# Patient Record
Sex: Male | Born: 1945 | Hispanic: No | Marital: Single | Smoking: Former smoker
Health system: Southern US, Community
[De-identification: ages and names within clinical notes are randomized; demographics above are authoritative.]

## PROBLEM LIST (undated history)

## (undated) DIAGNOSIS — R569 Unspecified convulsions: Secondary | ICD-10-CM

## (undated) DIAGNOSIS — J309 Allergic rhinitis, unspecified: Secondary | ICD-10-CM

## (undated) DIAGNOSIS — I10 Essential (primary) hypertension: Secondary | ICD-10-CM

## (undated) DIAGNOSIS — M109 Gout, unspecified: Secondary | ICD-10-CM

## (undated) DIAGNOSIS — F329 Major depressive disorder, single episode, unspecified: Secondary | ICD-10-CM

## (undated) DIAGNOSIS — D649 Anemia, unspecified: Secondary | ICD-10-CM

## (undated) DIAGNOSIS — E7800 Pure hypercholesterolemia, unspecified: Secondary | ICD-10-CM

## (undated) DIAGNOSIS — C61 Malignant neoplasm of prostate: Secondary | ICD-10-CM

## (undated) DIAGNOSIS — I739 Peripheral vascular disease, unspecified: Secondary | ICD-10-CM

## (undated) DIAGNOSIS — F419 Anxiety disorder, unspecified: Secondary | ICD-10-CM

## (undated) DIAGNOSIS — I96 Gangrene, not elsewhere classified: Secondary | ICD-10-CM

## (undated) DIAGNOSIS — I639 Cerebral infarction, unspecified: Secondary | ICD-10-CM

## (undated) DIAGNOSIS — F32A Depression, unspecified: Secondary | ICD-10-CM

## (undated) DIAGNOSIS — Z923 Personal history of irradiation: Secondary | ICD-10-CM

## (undated) DIAGNOSIS — R000 Tachycardia, unspecified: Secondary | ICD-10-CM

## (undated) DIAGNOSIS — C3490 Malignant neoplasm of unspecified part of unspecified bronchus or lung: Secondary | ICD-10-CM

## (undated) DIAGNOSIS — I219 Acute myocardial infarction, unspecified: Secondary | ICD-10-CM

## (undated) DIAGNOSIS — I2510 Atherosclerotic heart disease of native coronary artery without angina pectoris: Secondary | ICD-10-CM

## (undated) DIAGNOSIS — G8190 Hemiplegia, unspecified affecting unspecified side: Secondary | ICD-10-CM

## (undated) HISTORY — DX: Unspecified convulsions: R56.9

## (undated) HISTORY — DX: Allergic rhinitis, unspecified: J30.9

## (undated) HISTORY — DX: Essential (primary) hypertension: I10

## (undated) HISTORY — PX: ABOVE KNEE LEG AMPUTATION: SUR20

---

## 2007-04-27 ENCOUNTER — Encounter: Admission: RE | Admit: 2007-04-27 | Discharge: 2007-06-09 | Payer: Self-pay | Admitting: Cardiovascular Disease

## 2007-06-18 ENCOUNTER — Inpatient Hospital Stay (HOSPITAL_COMMUNITY): Admission: EM | Admit: 2007-06-18 | Discharge: 2007-06-22 | Payer: Self-pay | Admitting: Emergency Medicine

## 2007-06-20 ENCOUNTER — Encounter (INDEPENDENT_AMBULATORY_CARE_PROVIDER_SITE_OTHER): Payer: Self-pay | Admitting: Internal Medicine

## 2007-06-30 ENCOUNTER — Ambulatory Visit: Payer: Self-pay | Admitting: Internal Medicine

## 2007-07-15 ENCOUNTER — Ambulatory Visit: Payer: Self-pay | Admitting: Internal Medicine

## 2007-08-09 ENCOUNTER — Ambulatory Visit: Payer: Self-pay | Admitting: Internal Medicine

## 2007-09-19 ENCOUNTER — Ambulatory Visit: Payer: Self-pay | Admitting: Internal Medicine

## 2007-09-19 ENCOUNTER — Ambulatory Visit: Payer: Self-pay | Admitting: Vascular Surgery

## 2007-09-19 ENCOUNTER — Emergency Department (HOSPITAL_COMMUNITY): Admission: EM | Admit: 2007-09-19 | Discharge: 2007-09-19 | Payer: Self-pay | Admitting: Emergency Medicine

## 2007-10-03 ENCOUNTER — Ambulatory Visit: Payer: Self-pay | Admitting: Vascular Surgery

## 2007-10-23 ENCOUNTER — Inpatient Hospital Stay (HOSPITAL_COMMUNITY): Admission: EM | Admit: 2007-10-23 | Discharge: 2007-11-08 | Payer: Self-pay | Admitting: Emergency Medicine

## 2007-10-23 ENCOUNTER — Ambulatory Visit: Payer: Self-pay | Admitting: Internal Medicine

## 2007-10-26 ENCOUNTER — Encounter: Payer: Self-pay | Admitting: Vascular Surgery

## 2007-10-26 ENCOUNTER — Encounter (INDEPENDENT_AMBULATORY_CARE_PROVIDER_SITE_OTHER): Payer: Self-pay | Admitting: Cardiovascular Disease

## 2007-10-26 ENCOUNTER — Ambulatory Visit: Payer: Self-pay | Admitting: Vascular Surgery

## 2007-12-05 ENCOUNTER — Ambulatory Visit: Payer: Self-pay | Admitting: Vascular Surgery

## 2008-01-03 ENCOUNTER — Ambulatory Visit: Payer: Self-pay | Admitting: Internal Medicine

## 2008-05-09 ENCOUNTER — Ambulatory Visit: Payer: Self-pay | Admitting: Internal Medicine

## 2008-07-31 ENCOUNTER — Encounter: Admission: RE | Admit: 2008-07-31 | Discharge: 2008-09-26 | Payer: Self-pay | Admitting: Internal Medicine

## 2008-10-02 ENCOUNTER — Encounter: Admission: RE | Admit: 2008-10-02 | Discharge: 2008-11-21 | Payer: Self-pay | Admitting: Internal Medicine

## 2009-01-13 ENCOUNTER — Encounter: Admission: RE | Admit: 2009-01-13 | Discharge: 2009-04-13 | Payer: Self-pay | Admitting: Internal Medicine

## 2009-03-10 ENCOUNTER — Emergency Department (HOSPITAL_COMMUNITY): Admission: EM | Admit: 2009-03-10 | Discharge: 2009-03-10 | Payer: Self-pay | Admitting: Emergency Medicine

## 2009-04-22 DIAGNOSIS — C61 Malignant neoplasm of prostate: Secondary | ICD-10-CM

## 2009-04-22 HISTORY — PX: PROSTATE BIOPSY: SHX241

## 2009-04-22 HISTORY — DX: Malignant neoplasm of prostate: C61

## 2009-06-05 ENCOUNTER — Ambulatory Visit: Admission: RE | Admit: 2009-06-05 | Discharge: 2009-09-03 | Payer: Self-pay | Admitting: Radiation Oncology

## 2009-09-03 ENCOUNTER — Ambulatory Visit: Admission: RE | Admit: 2009-09-03 | Discharge: 2009-09-26 | Payer: Self-pay | Admitting: Radiation Oncology

## 2009-09-29 ENCOUNTER — Ambulatory Visit: Admission: RE | Admit: 2009-09-29 | Discharge: 2009-10-31 | Payer: Self-pay | Admitting: Radiation Oncology

## 2011-01-04 LAB — DIFFERENTIAL
Basophils Absolute: 0 10*3/uL (ref 0.0–0.1)
Basophils Relative: 1 % (ref 0–1)
Lymphocytes Relative: 38 % (ref 12–46)
Monocytes Absolute: 0.3 10*3/uL (ref 0.1–1.0)
Monocytes Relative: 7 % (ref 3–12)
Neutrophils Relative %: 49 % (ref 43–77)

## 2011-01-04 LAB — URINALYSIS, ROUTINE W REFLEX MICROSCOPIC
Glucose, UA: NEGATIVE mg/dL
Hgb urine dipstick: NEGATIVE
Ketones, ur: 15 mg/dL — AB
Nitrite: NEGATIVE
Specific Gravity, Urine: 1.023 (ref 1.005–1.030)
Urobilinogen, UA: 1 mg/dL (ref 0.0–1.0)
pH: 6 (ref 5.0–8.0)

## 2011-01-04 LAB — CBC
HCT: 40.7 % (ref 39.0–52.0)
MCHC: 33.7 g/dL (ref 30.0–36.0)
MCV: 93.7 fL (ref 78.0–100.0)
RBC: 4.34 MIL/uL (ref 4.22–5.81)
RDW: 13.9 % (ref 11.5–15.5)
WBC: 4.1 10*3/uL (ref 4.0–10.5)

## 2011-01-04 LAB — BASIC METABOLIC PANEL
Chloride: 109 mEq/L (ref 96–112)
GFR calc Af Amer: >60 mL/min (ref >60)
GFR calc non Af Amer: >60 mL/min (ref >60)

## 2011-01-04 LAB — HEMOCCULT GUIAC POC 1CARD (OFFICE): Fecal Occult Bld: NEGATIVE

## 2011-01-04 LAB — URINE CULTURE: Colony Count: NO GROWTH

## 2011-02-09 NOTE — H&P (Signed)
NAMESILVERIO, HAGAN            ACCOUNT NO.:  [id number]   MEDICAL RECORD NO.:  [id number]          PATIENT TYPE:  INP   LOCATION:  3028                         FACILITY:  [facility]   PHYSICIAN:  Della Goo, M.D. DATE OF BIRTH:  03/17/1946   DATE OF ADMISSION:  06/18/2007  DATE OF DISCHARGE:                              HISTORY & PHYSICAL   PRIMARY MEDICAL DOCTOR:  This is an unassigned patient.   CHIEF COMPLAINT:  Left-sided facial droop.   HISTORY OF PRESENT ILLNESS:  This is a 64 year old male presenting to  the emergency department emergently secondary to sudden onset of mild  twisting and left-sided facial droop.  The patient reports having these  symptoms 40 minutes prior to calling EMS.  He also reports having a  history of five previous cerebrovascular accidents in the past.  The  patient has recently relocated to this area from [place], [place].  He reports having mouth twisting and worsening difficulty  speaking.   PAST MEDICAL HISTORY:  1. CVAs times five per his report.  2. Hyperlipidemia.  3. Coronary artery disease.   PAST SURGICAL HISTORY:  Open heart surgery.   MEDICATIONS:  Aspirin, Plavix, loratadine, Lipitor, Melanex, Meloxicam  and Darvocet p.r.n.   ALLERGIES:  NO KNOWN DRUG ALLERGIES.   SOCIAL HISTORY:  The patient lives with his brother and his sister-in-  law who live in the area.  He is a smoker, smokes one half-pack per day,  and he denies alcohol usage in any illicit drug usage.   FAMILY HISTORY:  The patient is unable to give.   REVIEW OF SYSTEMS:  The patient denies having any headache, nausea,  vomiting, fever, chills, chest pain or shortness of breath.  He denies  having any syncope.  He does report having dizziness and weakness along  the right side of his body.   REVIEW OF SYSTEMS:  Pertinents are mentioned above.   PHYSICAL EXAMINATION FINDINGS:  GENERAL:  This is an older than stated  age appearing 65 year old  well-nourished, well-developed male in  discomfort, but no acute distress.  Currently in no acute distress.  VITAL SIGNS: Temperature 98.5, blood pressure 125/79, heart rate 77,  respirations 18, O2 saturations 98% on room air.  HEENT:  Normocephalic, atraumatic.  There is a left-sided facial  drooping.  Pupils are reactive to light bilaterally, extraocular muscles  are intact.  Oropharynx clear.  Dentition poor.  NECK:  Supple, full range of motion.  No thyromegaly, adenopathy,  jugulovenous distention.  No carotid bruits.  CARDIOVASCULAR:  Regular rate and rhythm.  No murmurs, gallops or rubs.  LUNGS:  Clear to auscultation bilaterally.  ABDOMEN:  Positive bowel sounds, soft, nontender, nondistended.  EXTREMITIES:  Without cyanosis, clubbing or edema.   LABORATORY STUDIES:  White blood cell count 6.4, hemoglobin 14.7,  hematocrit 42.9, MCV 94.6, platelets 156, neutrophils 49%, lymphocytes  36%, sodium 140, potassium 4.3, chloride 103, bicarb 29.4, BUN 12,  creatinine 1.1 and glucose 97.  Protime 13.9, INR of 1.1, PTT 30,  sedimentation rate 3.   ASSESSMENT:  53. A 65 year old male being admitted with a cerebrovascular  accident.  2. Hyperlipidemia.  3. Tobacco abuse.  4. History of seizures.   PLAN:  The patient will be admitted to telemetry area for cardiac  monitoring.  Cardiac enzymes will be performed.  The patient will be  placed on nitrates, oxygen, aspirin therapy.  Neurologic checks will  also be ordered q.2 h times 6 and then q. 4 hours x3.  A CT of the brain  has been ordered without contrast.  An MRI and MRA study will be ordered  along with carotid ultrasound studies.  The patient will be placed on  aspirin therapy along with DVT and GI prophylaxis.  A nicotine patch has  been ordered.   ADDENDUM:  The patient was moved to the floor and suffered a seizure  which was witnessed by nursing staff.  The patient was medicated with IV  Ativan and then loaded with Dilantin  therapy and started on Dilantin.  The alcohol withdrawal protocol was also ordered.  And an alcohol level  was also ordered.      Della Goo, M.D.  Electronically Signed     HJ/MEDQ  D:  06/18/2007  T:  06/19/2007  Job:  [id number]

## 2011-02-09 NOTE — Consult Note (Signed)
[name], [name]            ACCOUNT NO.:  [id number]   MEDICAL RECORD NO.:  [id number]          PATIENT TYPE:  INP   LOCATION:  3028                         FACILITY:  [facility]   PHYSICIAN:  Pramod P. Pearlean Brownie, MD    DATE OF BIRTH:  September 08, 1946   DATE OF CONSULTATION:  DATE OF DISCHARGE:                                 CONSULTATION   REFERRING PHYSICIAN:  [facility].   REASON FOR REFERRAL:  Seizure.   HISTORY OF PRESENT ILLNESS:  [name] is a 65 year old Caucasian  male who was admitted yesterday with episode of left facial twitching  for about five to ten minutes.  This resolved after he came to the  emergency room.  He has had previous such episodes since 2003; he can  remember about three or four such occasions which were also transient;  did not seek medical help at that time.  He does have a significant past  neurological history of large right hemispheric infarct in 2002 with  residual left-sided weakness and vision loss.  He has had some transient  worsening in the past which he calls strokes that were probably related  to seizures and transient weakness from that.  He denied any loss of  consciousness, headache, or any worsening of his existing neurological  deficit yesterday.   PAST MEDICAL HISTORY:  Significant for:  1. 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  2. Coronary artery disease.  3. Hyperlipidemia.   PAST SURGICAL HISTORY:  Open hip surgery.   MEDICATIONS AT HOME:  1. Aspirin.  2. Plavix.  3. Loratadine.  4. Lipitor.  5. Meloxicam.  6. Darvocet.   MEDICATION ALLERGIES:  None.   SOCIAL HISTORY:  Patient lives with brother and his wife.  He smokes  half-a-pack per day, does not drink alcohol or do drugs.   FAMILY HISTORY:  Not significant for neurological problems.   REVIEW OF SYSTEMS:  Significant for weakness only.   PHYSICAL EXAMINATION:  GENERAL:  Reveals a frail, middle-age Caucasian  male not in distress.  VITAL SIGNS:  Afebrile, temperature 98.2, pulse rate 66 per minute  regular, respiratory rate 24 per minute, blood pressure 108/65, sats 99%  on room air.  HEAD:  Nontraumatic.  NECK:  Supple without bruits.  ENT EXAM:  Unremarkable.  CARDIAC:  Regular heart sounds.  LUNGS:  Clear to auscultation.  ABDOMEN:  Soft, nontender.  NEUROLOGIC EXAM:  Patient is awake, alert, oriented to time, place and  person.  There is no aphasia  , apraxia or dysarthria.  Eye movements  are full range.  He has a dense left homonymous hemianopsia.  He has  mild left lower facial weakness.  Tongue is midline.  Motor system exam  reveals dense left upper extremity weakness with 0/5 strength.  Left  lower extremity strength is 3/5 proximally and 1/5 distally with  significant increased tone on the left.  Coordination was intact on the  left.  Sensation was intact.  Left plantars upgoing, right is downgoing.  Gait was not tested.   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   Diffusion-weighted imaging shows weakly positive lesions in left frontal  and parietal periventricular regions, probably seizure related.   MRA of the brain reveals occluded right ICA and MCA, both of which are  chronic; the right vertebral artery was __________ not seen __________  occluded.   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  He has had this on more  than one occasion.   PLAN:  1. I agree with treatment for partial seizures, but I would prefer      Keppra 250 twice a day which is better than Dilantin.  2. Discontinue Dilantin.  3. Check EEG.  4. Continue physical and occupational therapy consults.  5. No further stroke workup is needed at this time.  6. I have counseled him to quit smoking  and to maintain strict control      of his hyperlipidemia.  7. Maintain anticoagulation; keep systolic blood pressure at 120 to      140.   Thank you for the referring of patient.           ______________________________  Sunny Schlein. Pearlean Brownie, MD     PPS/MEDQ  D:  06/19/2007  T:  06/19/2007  Job:  [id number]

## 2011-02-09 NOTE — Discharge Summary (Signed)
NAME:  [name], [name]           ACCOUNT NO.:  [id number]   MEDICAL RECORD NO.:  [id number]          PATIENT TYPE:  INP   LOCATION:  2010                         FACILITY:  [facility]   PHYSICIAN:  Quita Skye. Hart Rochester, M.D.  DATE OF BIRTH:  1946-07-17   DATE OF ADMISSION:  10/23/2007  DATE OF DISCHARGE:  11/03/2007                               DISCHARGE SUMMARY   ADMISSION DIAGNOSIS:  Left foot gangrene.   DISCHARGE/SECONDARY DIAGNOSES:  1. Left foot gangrene, status post left above-knee amputation.  2. Sinus tachycardia, improved.  Probably related to sepsis.  3. Mild anemia, status post transfusion.  4. One of two positive blood cultures showing Staphylococcus coagulase      negative bacteria.  Treated with a course of Zinacef.  5. History of coronary artery disease, status post coronary artery      bypass grafting in 2000 in [place], [place].  6. 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  7. Carotid artery stenosis, status post right carotid endarterectomy.  8. Hypertension.  9. Seizure disorder.  10.Peripheral vascular disease.  11.Tobacco abuse.  12.Mild dysphagia but no overt signs or symptoms of aspiration.   No known drug allergies.   PAST SURGICAL HISTORY:  1. History of tonsillectomy.  2. History of appendectomy.  3. History of craniotomy.   PROCEDURES:  Left above-the-knee amputation by Dr. Josephina Gip.   BRIEF HISTORY:  [name] is a 65 year old Caucasian male who was  seen by on-call vascular surgeon, Dr. Fabienne Bruns, on October 23, 2007 for ischemic left foot.  He has known peripheral vascular disease  and has been followed in the past by Dr. Josephina Gip, most recently on  October 03, 2007.  At that time, a left above-the-knee amputation was  recommended; however, the patient refused.  On October 23, 2007, his  pain was worsening and presented to the [facility] emergency department  for further treatment and  evaluation.  He denied fevers or chills at  that time.   CONSULTS:  1. Cardiology, Dr. Arvilla Meres (however, primary cardiologist is      Dr. Algie Coffer).  2. Physical therapy.  3. Clinical Child psychotherapist.  4. Speech pathology.   HOSPITAL COURSE:  [name] was admitted to [facility] on  October 23, 2007 to the vascular surgery service.  He was transferred to  a general floor and placed on pain medication and subcutaneous heparin  with plans for him to be re-evaluated the following morning by his  primary vascular surgeon, Dr. Josephina Gip.  He was seen by Dr. Hart Rochester  on October 24, 2007.  At that time, Dr. Hart Rochester continued to recommend  left above-the-knee amputation but also noticed that his heart rate was  running in the 140s.  EKG had been done, and it appeared to be in sinus  tachycardia.  The patient, with his history of coronary artery disease,  the patient did report he had a cardiologist in [place] but was  unable to provide his name; therefore [facility] was initiated  called.  He was seen by Dr. Reuel Boom  Bensimhon.  In the meantime, he is  written to be transferred to the telemetry floor and written for  Lopressor p.r.n. elevated heart rate.  Once evaluated by Dr. Gala Romney,  cardiology was more concerned that this was possibly related to sepsis,  presumably from gangrene of his left foot.  He was treated with some IV  fluids, a 2D echocardiogram, and labs were written for.  He was also  transferred to telemetry on 2000 that day.  He was started on IV Ancef.  Since his white count was moderately elevated at around 14,000 to  15,000.  The patient denied chest pain or shortness of breath.  He did  undergo some labs which showed a normal T4 of 1.54 and TSH of 1.497.  Sed rate elevated at 69.  Normal renal function.  However, one blood  culture did grow staphylococcus species coagulase-negative bacteria;  however, the second culture was negative.  The one  positive blood  culture had unknown significance, and then other cultures were obtained,  other than plans to proceed with the amputation and plan to continue him  on antibiotics.  Prior to scheduling surgery, he did require morphine  PCA for pain management.  Dr. Gala Romney was also able to get in touch  with Mr. Flury's primary cardiologist, who is Dr. Algie Coffer, who  treated his tachycardia until controlled during this hospitalization.  He was cleared with hydration, antibiotics, and Lopressor p.r.n.  His  heart rate did show some improvement but was still slightly tachycardic  but more in the 110s than the 140s.  He also had a 2D echocardiogram  which showed overall left ventricular systolic function normal.  Left  ventricular ejection fraction estimated at 60%.  Mild hypokinesis of the  mid septal wall.  Increased relative contribution of atrial contraction  to left ventricular filling.  Mild right ventricular hypertrophy.   Since his tachycardia was felt better controlled overall and because we  wondered if sepsis was a contributing factor, it was felt we should  proceed with left above-the-knee amputation as soon as possible.  This  was performed on October 26, 2007.  Following surgery, he returned to  telemetry in 2000.  It is anticipated he will remain there until  discharge.  He was continued on morphine PCA but was actually later  transitioned to low dose Dilaudid because he had some itching with the  morphine.  His heart rate continued to be a suboptimal rate.  A decision  was made to go ahead and transfuse him with 2 units of packed red blood  cells for hemoglobin of 9.4 and 27.4.  Following the amputation and  continued IV fluids and transfusion, his heart rate ultimately improved  and was in the 80s and 90s for the remainder of his hospitalization.  Of  note, he did have a Hemovac drain placed intraoperatively.  We were able  to discontinue this on postoperative day #1.  We  did consult physical  therapy to assist with mobility.  They recommended skilled nursing care,  at least short term, unless he was able to have 24-hour supervision at  home.  Initially, it was felt that his brother and his brother's  girlfriend would be able to provide this, but with further questioning  of the patient and the patient's sister, it was felt that he would more  likely benefit from care to skilled nursing facility.  At this point, a  clinical social worker has been consulted and is attempting to  find bed  availability in the local area for skilled nursing care.   PHYSICAL EXAMINATION:  At the time of dictation, Mr. Ambrosio remains  hemodynamically stable.  His vitals show a blood pressure of 98/50,  temperature 98.7, heart rate in the 80s and 90s in sinus rhythm, oxygen  saturation 98% on room air.  His left above-the-knee amputation site appears to be healing well  without signs of infection.   His most recent labs from February 5th show a minimally decreased sodium  of 132, potassium 3.8, chloride 99, CO2 26, blood glucose 91, BUN 3,  creatinine 0.78, calcium 8.3.  White blood count is 7.1, hemoglobin  12.4, hematocrit 37, platelet count 256.   Again, postoperatively, he has remained on Zinacef and has received 7-8  days of IV antibiotics.   During this hospitalization, the family actually reported that they felt  he was having more difficulty with swallowing, and this had been noted  at home.  As a precaution, we did ask speech therapy to do a swallow  screen, and they felt that he had no overt signs or symptoms of  aspiration and felt that most of his problems were secondary to not  having his dentures available at the hospital.  Therefore, he was  downgraded to dysphagia III mechanical soft diet with thin liquids but  with an okay to upgrade to a regular diet once dentures were made  available.   DISPOSITION:  At this point, it is felt Mr. Underwood can be  discharged  to a skilled nursing facility for at least short-term rehab over the  next 24 hours; however, this will depend on bed availability and  family's cooperation.  Again, the patient's brother and his girlfriend  had initially desired for the patient to be discharged to their house.  If this is ultimately the case, we will need to get a home health social  worker to be involved to insure that he is getting adequate care.   DISCHARGE MEDICATIONS:  1. Oxycodone 5 mg 1-2  tablets p.o. q.4h. p.r.n. pain.  2. Lipitor 40 mg p.o. q.p.m.  3. Colace 100 mg 1-2 tablets p.o. daily p.r.n. constipation.  4. Coated aspirin 81 mg p.o. daily.  5. Relafen 750 mg p.o. b.i.d.  6. Lisinopril 10 mg p.o. daily.  7. Keppra 250 mg p.o. b.i.d.  8. Celexa 20 mg p.o. q.a.m.  9. Plavix 75 mg p.o. daily.  10.Detrol LA 4 mg p.o. daily.   DISCHARGE INSTRUCTIONS:  1. Continue dysphagia III mechanical soft diet with thin liquids      unless he has dentures available, then be upgraded to regular      consistency diet.  2. His left AKA stump should be cleaned daily gently with soap and      water and covered with a dry gauze dressing and Ace wrap.  Please      change daily.  He should be out of bed with assistance only.      Physical therapy should continue treatments as indicated.  3. Dr. Hart Rochester should be notified if he develops pertinent fever      greater than 101, redness, purulent drainage or separation from his      AKA incision site.  4. He should see Dr. Hart Rochester in approximately 3-4 weeks.  Our office      will arrange followup.  5. He should call Dr. Algie Coffer for 4-6 week followup for continued      followup of his  coronary artery disease, hyperlipidemia, and sinus      tachycardia.      Jerold Coombe, P.A.      Quita Skye Hart Rochester, M.D.  Electronically Signed    AWZ/MEDQ  D:  11/02/2007  T:  11/02/2007  Job:  [id number]   cc:   Bevelyn Buckles. Bensimhon, MD  Ricki Rodriguez, M.D.  [facility]

## 2011-02-09 NOTE — Consult Note (Signed)
NAME:  [name], [name]           ACCOUNT NO.:  [id number]   MEDICAL RECORD NO.:  [id number]          PATIENT TYPE:  EMS   LOCATION:  MAJO                         FACILITY:  [facility]   PHYSICIAN:  Quita Skye. Hart Rochester, M.D.  DATE OF BIRTH:  Mar 12, 1946   DATE OF CONSULTATION:  09/19/2007  DATE OF DISCHARGE:  09/19/2007                                 CONSULTATION   CHIEF COMPLAINT:  Chronic pain left lower extremity.   HISTORY OF PRESENT ILLNESS:  This patient was referred by the emergency  room physician for consultation regarding lower extremity occlusive  disease.  The patient states that he has been having pain in the left  leg over the last 6 or 8 months, which has slightly worsened.  He does  not have he has not been ambulatory since a right brain stroke occurred  in 2001.  He had a right carotid endarterectomy following that in  [place], but has had left-sided weakness with a flexion contracture to  some degree in the left knee and foot since that time.  He he denies any  history of gangrene infection or ulceration in the left foot.  He tries  to ambulate with the help of walker, but is very effective with this.   PAST MEDICAL HISTORY:  Significant for vascular disease, having  undergone coronary artery bypass grafting in 2000 in [place] and a  right carotid endarterectomy 2001.  Also has a history of hypertension  and a seizure disorder.   SOCIAL HISTORY:  Positive for smoking and denies any alcohol or drug  abuse.   PHYSICAL EXAMINATION:  VITAL SIGNS:  Blood pressure 120/70, heart rate  100, respirations 14, temperature 97.  GENERAL:  He is alert and oriented x3, chronically ill appearing the  patient.  NECK:  Supple with a right neck incision,  3+ carotid pulses  bilaterally.  NEUROLOGIC:  Reveals left hemiparesis particular with the left lower  extremity.  CHEST:  Clear to auscultation.  Upper extremity pulses 3+ bilaterally.  ABDOMEN:  Soft, nontender with no palpable  masses.  EXTREMITIES:  Reveals 3+ femoral, 2+ popliteal, and absent pulses  palpable on the right side.  Left leg has a 3+ femoral with absent  popliteal and distal pulses.  Left foot has a flexion contracture.  He  wants to hold the left leg in the in a flexed position at the knee, but  am able to straighten that with some work.  He has no active infection.  ulceration or gangrene in the left foot.  He does have intact sensation  in the left foot.  There is little motion there, which has been no  change since his stroke he states.  I did hear Doppler flow in his left  anterior tibial and posterior tibial arteries. which was sluggish and  monophasic.   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  The patient is nonambulatory status-post right brain stroke.  Would not intervene for any condition in this patient other than  possible limb salvage if he developed infection gangrene her ulceration.  If so we would be happy  to see him and further evaluate him in our  office.      Quita Skye Hart Rochester, M.D.  Electronically Signed     JDL/MEDQ  D:  09/19/2007  T:  09/20/2007  Job:  [id number]

## 2011-02-09 NOTE — Op Note (Signed)
NAME:  [name], [name]           ACCOUNT NO.:  [id number]   MEDICAL RECORD NO.:  [id number]          PATIENT TYPE:  INP   LOCATION:  2010                         FACILITY:  [facility]   PHYSICIAN:  Quita Skye. Hart Rochester, M.D.  DATE OF BIRTH:  1946-05-04   DATE OF PROCEDURE:  10/26/2007  DATE OF DISCHARGE:                               OPERATIVE REPORT   PREOPERATIVE DIAGNOSIS:  Gangrenous left foot secondary to severe  femoral, popliteal and tibial occlusive disease.   POSTOPERATIVE DIAGNOSIS:  Gangrenous left foot secondary to severe  femoral, popliteal and tibial occlusive disease.   OPERATION:  Left above-knee amputation.   SURGEON:  Quita Skye. Hart Rochester, M.D.   FIRST ASSISTANT:  Amanda L. Webster, PA   ANESTHESIA:  General endotracheal.   PROCEDURE:  The patient was taken to the operating room AND placed in  the supine position, at which time satisfactory general endotracheal  anesthesia was administered.  The left leg was prepped with Betadine  scrub and solution and draped in a routine sterile manner, isolating the  gangrenous foot with an impervious stockinette.  The patient had a  flexion contracture of both the hip and the knee.  Skin flaps for a mid  thigh above-knee amputation were then marked with equal anterior and  posterior flaps.  Incision carried down through the skin and  subcutaneous tissue with a scalpel.  The muscle was divided with the  Bovie and the femur cleaned proximally with a periosteal elevator.  The  femur was divided with the Stryker saw, beveling it slightly anteriorly  and smoothing it with a rasp.  The superficial femoral artery, vein and  a nerve for individually ligated with 2-0 silk ties and ligature,  allowed to retract proximally, and the posterior muscles divided with  Bovie and the specimen removed from the table.  Adequate hemostasis was  achieved and following thorough irrigation with saline, medium Hemovac  drains were brought out medially and  laterally, secured with silk  sutures.  The wound was closed with interrupted Vicryl for the fascial  layer over the bone, followed by subcuticular 2-0 Vicryl and skin clips.  Sterile dressing applied.  The patient taken to the recovery in  satisfactory condition.      Quita Skye Hart Rochester, M.D.  Electronically Signed     JDL/MEDQ  D:  10/26/2007  T:  10/26/2007  Job:  [id number]

## 2011-02-09 NOTE — Procedures (Signed)
EEG NUMBER:  [id number].   HISTORY:  This is a 65 year old with a history of onset of twisting and  left-sided facial droop, history of stroke and a history of seizures in  the past.  The patient is being evaluated for seizures.  This is a  routine EEG.  No skull defects are noted.  Medications include Lovenox,  aspirin, Claritin, Plavix, Keppra, Protonix, oxycodone, Zofran, Ambien,  Dilaudid, Ativan and Dilantin.   EEG classification:  Dysrhythmia grade 1, generalized.   DESCRIPTION OF RECORDING:  The background rhythms of this recording  consists of a somewhat poorly modulated background activities of 7-8 Hz.  As record progresses, intermittent bitemporal slowing is seen off and on  during the recording.  Photic stimulation is performed resulting in a  minimal but bilateral photic drive response.  Hyperventilation was not  performed.  At times during the recording, the patient appears to enter  the drowsy state with more prominent background slowing seen.  During  periods of alertness, muscle artifact becomes more prominent and the  background rhythms become a bit better organized.  At no time during the  recording does there appear to be evidence of spike, spike wave  discharges or evidence of focal slowing.  EKG monitor shows no evidence  of cardiac rhythm abnormalities with a heart rate of 66.   IMPRESSION:  This is mildly abnormal EEG recording due to generalized  slowing seen.  This is nonspecific and can be seen with any process that  results in a mild metabolic or toxic encephalopathy and can be seen with  any dementing type illness.  No evidence of epileptiform discharges were  seen.      Marlan Palau, M.D.  Electronically Signed     ZOX:WRUE  D:  06/21/2007 20:37:35  T:  06/22/2007 12:00:46  Job #:  [id number]

## 2011-02-09 NOTE — Discharge Summary (Signed)
NAME:  [name], [name]           ACCOUNT NO.:  [id number]   MEDICAL RECORD NO.:  [id number]          PATIENT TYPE:  INP   LOCATION:  2010                         FACILITY:  [facility]   PHYSICIAN:  Quita Skye. Hart Rochester, M.D.  DATE OF BIRTH:  1945/11/26   DATE OF ADMISSION:  10/23/2007  DATE OF DISCHARGE:  11/07/2007                               DISCHARGE SUMMARY   ADDENDUM:  This is an addendum to the original discharge summary done by  Jerold Coombe, P.A. for indicated discharge of November 03, 2007,  job [phone].   The patient will be discharging to [place] for short term care  as of November 07, 2007.  There has been no change in the patient's  status.  The patient remains stable.  There was a change in discharge  medication.   ADDITIONAL DISCHARGE MEDICATIONS:  1. Nicotine patch 14 mg a day.  2. Lactulose 15-30 mL once a day as needed.  3. Zinacef 1.5 grams IV q.12 hours until November 08, 2007.  The      patient will receive this at facility.   CARE INSTRUCTIONS:  The patient needs to keep lotion on the right lower  extremity daily and that can be kept at bedside.   The patient is indicated for discharge to [place] today on  November 07, 2007.  The patient is stable.      Cyndy Freeze, PA      Quita Skye. Hart Rochester, M.D.  Electronically Signed    ALW/MEDQ  D:  11/07/2007  T:  11/08/2007  Job:  [id number]

## 2011-02-09 NOTE — Discharge Summary (Signed)
[name], [name]            ACCOUNT NO.:  [id number]   MEDICAL RECORD NO.:  [id number]          PATIENT TYPE:  INP   LOCATION:  3028                         FACILITY:  [facility]   PHYSICIAN:  Hettie Holstein, D.O.    DATE OF BIRTH:  Jun 28, 1946   DATE OF ADMISSION:  06/18/2007  DATE OF DISCHARGE:  06/22/2007                               DISCHARGE SUMMARY   FINAL DIAGNOSES:  1. 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 Delia Heady or neurology.  2. 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  3. Coronary artery disease, stable.  4. Peripheral vascular disease and carotid artery stenosis, status      post Doppler evaluation revealing 60-80% right internal carotid      artery stenosis, with the left revealing no significant internal      carotid stenosis.  There was bilateral moderate calcific plaque.   STUDIES:  1. EKG revealed normal sinus rhythm with a nonspecific T-wave      abnormality.  2. MRI of the brain and MRA of the head revealed a punctuated acute      infarct in the right MCA territory representing acute on chronic      very advanced ischemia to this area and frontal and ethmoid sinus      disease.  This report was discussed with Dr. Delia Heady who felt      that this was chronic and not an acute finding.  For full details      please refer to the dictation per Dr. Pearlean Brownie.  He felt as though his      presentation was most consistent with a symptomatic  partial      seizures from an old large right hemispheric infarct. The MRA study      revealed decreased flow throughout the right cerebellar circulation      due to probable chronic occlusion of the right internal carotid      artery and subsequently little flow in the right HEA, right MCA and      right fetal PCA vessels though it is preserved well in the left  cerebral vessels.  His basel artery solely supplied by the left      vertebral and the right appeared to be chronically occluded.  He      underwent physical therapy evaluation who felt that he was at his      baseline from a therapy standpoint.  AFO was ordered for him.  He      also underwent an EEG that was nonspecific.   LABORATORY DATA:  Hemoglobin A1c 5.3, lipid profile revealed an LDL of  71, HDL of 29.  CBC revealed a WBC of 6.0, hemoglobin of 14 and a  platelet count of 136.  Basic metabolic panel revealed sodium 139,  potassium 3.5, BUN 5, creatinine 0.82.   DISPOSITION:  At present I am recommending that [name] establish a  primary care physician in the  area.  He was provided with an appointment  for [facility] on June 30, 2007.  In addition he was instructed to  follow up with Dr. Pearlean Brownie and provided contact information to schedule  followup of his vascular disease.   HISTORY OF PRESENT ILLNESS:  For full details, please refer to the H&P  as dictated by Dr. Lovell Sheehan, however, briefly, [name] is a 65-year-  old male presenting to the emergency department secondary to sudden  onset of mild twitching of his face.  He stated these occurred 40  minutes prior to calling EMS.  He had relocated here from an area of  [place].  Neurology was consulted and he underwent a stroke evaluation  without evidence of acute stroke.  Dr. Pearlean Brownie felt that this was most  predominantly related to partial seizure.  He was started on Keppra and  will follow with Dr. Pearlean Brownie in the outpatient setting, as well as  [facility].   Of note, his platelets were in the low normal range.  I would recommend  that he have a followup CBC within the next couple of weeks following  discharge through [facility].   MEDICATIONS ON DISCHARGE:  1. Plavix 75 mg daily  2. Meloxicam 7.5 mg daily on a p.r.n. basis.  3. Propoxyphene 2 q.h.s. as needed.  4. Loratadine 10 mg daily.  5. Lipitor 40 mg  q.h.s.  6. Detrol LA 4 mg daily as before.  7. Keppra 250 mg p.o. b.i.d.      Hettie Holstein, D.O.  Electronically Signed     ESS/MEDQ  D:  06/22/2007  T:  06/22/2007  Job:  [id number]   cc:   Pramod P. Pearlean Brownie, MD  [facility]

## 2011-02-09 NOTE — Consult Note (Signed)
NAMEMarland Kitchen  [name], [name]           ACCOUNT NO.:  [id number]   MEDICAL RECORD NO.:  [id number]          PATIENT TYPE:  INP   LOCATION:  3037                         FACILITY:  [facility]   PHYSICIAN:  Bevelyn Buckles. Bensimhon, MDDATE OF BIRTH:  1946/05/19   DATE OF CONSULTATION:  10/24/2007  DATE OF DISCHARGE:                                 CONSULTATION   REQUESTING PHYSICIAN:  Quita Skye. Hart Rochester, M.D., of vascular surgery.   PRIMARY CARE PHYSICIAN:  I suspect is [facility].   CARDIOLOGIST:  Discovered to be Ricki Rodriguez, M.D.   REASON FOR CONSULTATION:  Tachycardia.   [name] is a 65 year old male with multiple medical problems  including coronary artery disease, status post reported bypass surgery  in 2000, previous right MCA stroke with left hemiparesis, hypertension  and peripheral arterial disease.  He was admitted by Dr. 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   Since he has been in the hospital he has been noted to be quite  tachycardic with heart rates in the 130s-140s.  He denies any chest pain  or shortness of breath.  He does state he gets chest pain about once a  month at rest but this is brief and he does not have it with exertion.  His mobility is limited due to his left-sided hemiparesis, but he does  get around with a cane.  He denies any fevers but does report some  chills.  Otherwise he is a fairly poor historian and is only oriented to  person.   REVIEW OF SYSTEMS:  Notable for leg pain, particularly in his left knee.  Urinary urgency with chills.  He denies fever.  Denies heart failure  symptoms.  The remainder of the review of systems is negative except for  HPI and problem list.   PROBLEM LIST:  1. Coronary artery disease, status post CABG in 2000 in [place],      [place], [place] unknown.  2. Previous right MCA stroke with left-sided hemiparesis.  3. Carotid artery stenosis, status post right carotid endarterectomy.  4.  Hypertension.  5. Seizure disorder.  6. Peripheral vascular disease.   MEDICATIONS AT HOME:  1. Lisinopril 10 mg a day.  2. Darvocet.  3. Levetiracetam 250 mg b.i.d.  4. Citalopram 20 mg a day.  5. Aspirin 81 mg a day.  6. Nabumetone 750 mg a day.  7. Lopressor.   Here in the hospital he has been on lisinopril, subcutaneous heparin and  morphine.   SOCIAL HISTORY:  He is single.  He lives in [place] with his brother.  He is retired from the police department.  He continues to smoke but  says he stopped yesterday.  He firmly denies any alcohol or drug use.   FAMILY HISTORY:  Mother died at 67 due to problems with dementia.  He  does not know his father.   PHYSICAL EXAM:  He is half in and half out of the bed.  He is in no  acute distress.  Respirations are unlabored.  Blood pressure is 118/65, heart rate is  140, temperature is 98.9.  He is saturating 92% on room air.  HEENT:  Grossly normal.  NECK:  Supple.  There is no JVD.  Carotids are 2+ bilaterally.  There is  a right endarterectomy scar.  No bruits.  CARDIAC:  PMI is not displaced.  He is tachycardic and regular.  No  obvious murmur.  LUNGS:  Clear.  ABDOMEN:  Soft, nontender, nondistended.  There is no  hepatosplenomegaly, no bruits, no masses appreciated.  EXTREMITIES:  His left foot is bandaged and apparently is necrotic with  excoriations.  He has a left hemiparesis.  There is no edema.  NEUROLOGIC:  He is alert and oriented only to person.  He knows he is in  a hospital but thinks he is in [place] and tells me the year is 2007,  going on 2008.   Labs show a white count of 14.4 thousand with 85% polys, hemoglobin is  11, platelets are 311.  Sodium 130, potassium 4.0, BUN 12, creatinine  0.76, glucose is 104.  EKG shows sinus tachycardia at a rate of 139.   ASSESSMENT AND PLAN:  After extensive searching, we finally discovered  that [name]'s cardiologist is Dr. Algie Coffer, who is now aware of his   admission. He will follow with him in the hospital.   Given the patient's sinus tachycardia and significant leukocytosis and  marked left shift, I am very concerned over a bloodstream  infection/early sepsis.  Would recommend urgent transfer to telemetry  and treatment with IV fluids and blood cultures and broad-spectrum  antibiotics.  He will need also an echocardiogram.  I would also check a  sed rate to further evaluate for osteomyelitis and a thyroid panel.  He  will obviously need to be considered for a preop Myoview for risk  stratification if this has not been done already.  I have discussed the  case with the surgery team and they are aware.  Dr. Roseanne Kaufman office has  also been notified.   TOTAL TIME SPENT ON CONSULTATION:  Greater than one hour.      Bevelyn Buckles. Bensimhon, MD  Electronically Signed     DRB/MEDQ  D:  10/24/2007  T:  10/24/2007  Job:  [id number]

## 2011-02-09 NOTE — Assessment & Plan Note (Signed)
OFFICE VISIT   RAYDER, SULLENGER  DOB:  [birth year]                                       10/03/2007  ZHYQM#:[id number]   The patient was seen by me in the emergency department on December 23  for chronic pain in the left leg.  The patient has a consultation note  in the chart.  Basically, he has previously undergone coronary artery  bypass grafting and right carotid endarterectomy, both in [place] in  the past.  He has had a right brain stroke and has a chronic left  hemiparesis.  He has a significant left flexion contracture, and makes  it very difficult to straighten his left leg.  He has severe ischemia of  his left leg with some ulceration developing in the toes of the left  foot, but is not a candidate for revascularization.   EXAM:  He has palpable femoral pulses at 3+ bilaterally with absent  distal pulses in the left leg and the right leg.  I am unable to  straighten his leg completely today.  He is accompanied by his brother  Verdon Cummins.  Does have some gangrenous changes on the toes.  I have  recommended left above knee amputation, but he is not ready to accept  that at this time.  Blood pressure today is 97/61, heart rate is 111,  respirations 18.   His brother will call our office when he is ready to proceed with left  above knee amputation.  We will schedule this at that time.   Quita Skye Hart Rochester, M.D.  Electronically Signed   JDL/MEDQ  D:  10/03/2007  T:  10/04/2007  Job:  [id number]

## 2011-02-09 NOTE — Assessment & Plan Note (Signed)
OFFICE VISIT   [name], [name]  DOB:  09/19/1946                                       12/05/2007  JYNWG#:[id number]   The patient returns 6 weeks status post left above knee amputation for  severely ischemic left foot with flexion contracture of the left knee.  The amputation has healed nicely with no evidence of any infection in  the AKA stump.  Skin staples were removed today.  He is having some mild  aching discomfort in the thigh on the left side, and I have recommended  him take ibuprofen or Extra Strength Tylenol for this on p.r.n. basis.  Blood pressure today is 100/66, heart rate is 107.  He will return to  see [place] on a p.r.n. basis.   Quita Skye Hart Rochester, M.D.  Electronically Signed   JDL/MEDQ  D:  12/05/2007  T:  12/06/2007  Job:  [id number]

## 2011-06-17 LAB — BASIC METABOLIC PANEL
BUN: 11
BUN: 13
BUN: 2 — ABNORMAL LOW
CO2: 27
Calcium: 8.1 — ABNORMAL LOW
Calcium: 8.6
Chloride: 101
Chloride: 108
Chloride: 92 — ABNORMAL LOW
Creatinine, Ser: 0.76
Creatinine, Ser: 0.84
GFR calc Af Amer: >60
GFR calc Af Amer: >60
GFR calc Af Amer: >60
GFR calc non Af Amer: >60
GFR calc non Af Amer: >60
GFR calc non Af Amer: >60
Glucose, Bld: 104 — ABNORMAL HIGH
Glucose, Bld: 112 — ABNORMAL HIGH
Potassium: 3.9
Potassium: 4
Potassium: 4
Potassium: 4.3
Sodium: 130 — ABNORMAL LOW

## 2011-06-17 LAB — DIFFERENTIAL
Basophils Absolute: 0
Basophils Relative: 0
Eosinophils Absolute: 0.2
Eosinophils Relative: 1
Lymphocytes Relative: 9 — ABNORMAL LOW
Lymphs Abs: 1.2
Monocytes Absolute: 0.8
Monocytes Relative: 5
Neutro Abs: 12.2 — ABNORMAL HIGH
Neutrophils Relative %: 85 — ABNORMAL HIGH

## 2011-06-17 LAB — COMPREHENSIVE METABOLIC PANEL
Albumin: 2.1 — ABNORMAL LOW
Alkaline Phosphatase: 57
BUN: 14
Chloride: 99
Creatinine, Ser: 1.12
Glucose, Bld: 87
Potassium: 4.3
Total Bilirubin: 0.8

## 2011-06-17 LAB — CULTURE, BLOOD (ROUTINE X 2): Culture: NO GROWTH

## 2011-06-17 LAB — ABO/RH: ABO/RH(D): O POS

## 2011-06-17 LAB — URINALYSIS, ROUTINE W REFLEX MICROSCOPIC
Bilirubin Urine: NEGATIVE
Glucose, UA: NEGATIVE
Hgb urine dipstick: NEGATIVE
Ketones, ur: 15 — AB
Nitrite: NEGATIVE
Protein, ur: NEGATIVE
Specific Gravity, Urine: 1.026
Urobilinogen, UA: 0.2
pH: 6

## 2011-06-17 LAB — TYPE AND SCREEN
ABO/RH(D): O POS
Antibody Screen: NEGATIVE

## 2011-06-17 LAB — CBC
HCT: 27.4 — ABNORMAL LOW
HCT: 27.9 — ABNORMAL LOW
HCT: 28.8 — ABNORMAL LOW
HCT: 32.7 — ABNORMAL LOW
HCT: 32.8 — ABNORMAL LOW
HCT: 35.7 — ABNORMAL LOW
Hemoglobin: 11 — ABNORMAL LOW
Hemoglobin: 11 — ABNORMAL LOW
Hemoglobin: 12.1 — ABNORMAL LOW
MCHC: 33.5
MCHC: 34
MCHC: 34.3
MCV: 97
MCV: 97.6
Platelets: 242
Platelets: 281
Platelets: 303
Platelets: 311
RBC: 2.83 — ABNORMAL LOW
RBC: 2.84 — ABNORMAL LOW
RBC: 3.36 — ABNORMAL LOW
RBC: 3.36 — ABNORMAL LOW
RDW: 13.8
RDW: 14.2
RDW: 14.3
WBC: 10.2
WBC: 11.3 — ABNORMAL HIGH
WBC: 13 — ABNORMAL HIGH
WBC: 14.4 — ABNORMAL HIGH
WBC: 15.8 — ABNORMAL HIGH

## 2011-06-17 LAB — BASIC METABOLIC PANEL WITH GFR
BUN: 12
CO2: 27

## 2011-06-17 LAB — SEDIMENTATION RATE: Sed Rate: 69 — ABNORMAL HIGH

## 2011-06-17 LAB — TSH: TSH: 1.497

## 2011-06-17 LAB — URINE MICROSCOPIC-ADD ON

## 2011-06-17 LAB — PROTIME-INR
INR: 1.1
Prothrombin Time: 13.9

## 2011-06-17 LAB — APTT: aPTT: 41 — ABNORMAL HIGH

## 2011-06-18 LAB — BASIC METABOLIC PANEL
BUN: 2 — ABNORMAL LOW
BUN: 3 — ABNORMAL LOW
Calcium: 8.5
Creatinine, Ser: 0.67
GFR calc Af Amer: >60
GFR calc non Af Amer: >60
GFR calc non Af Amer: >60
Glucose, Bld: 98
Potassium: 3.8
Sodium: 132 — ABNORMAL LOW

## 2011-06-18 LAB — CBC
HCT: 37 — ABNORMAL LOW
Platelets: 256
Platelets: 258
RBC: 3.82 — ABNORMAL LOW
RDW: 14.4
WBC: 7.1

## 2011-07-02 LAB — DIFFERENTIAL
Basophils Absolute: 0
Eosinophils Relative: 2
Lymphocytes Relative: 15
Lymphs Abs: 1.1
Monocytes Absolute: 0.5

## 2011-07-02 LAB — POCT I-STAT CREATININE
Creatinine, Ser: 0.9
Operator id: 146091

## 2011-07-02 LAB — I-STAT 8, (EC8 V) (CONVERTED LAB)
BUN: 12
Chloride: 105
pCO2, Ven: 43.4 — ABNORMAL LOW
pH, Ven: 7.358 — ABNORMAL HIGH

## 2011-07-02 LAB — CBC
HCT: 41.3
Hemoglobin: 14
RDW: 14.5

## 2011-07-08 LAB — I-STAT 8, (EC8 V) (CONVERTED LAB)
Acid-Base Excess: 2
BUN: 12
Bicarbonate: 29.4 — ABNORMAL HIGH
HCT: 48
Hemoglobin: 16.3
Operator id: 279831
Sodium: 140
TCO2: 31
pCO2, Ven: 55.9 — ABNORMAL HIGH

## 2011-07-08 LAB — CARDIAC PANEL(CRET KIN+CKTOT+MB+TROPI)
CK, MB: 2.2
Relative Index: 2.1
Total CK: 110
Troponin I: 0.02
Troponin I: <0.01

## 2011-07-08 LAB — URINE DRUGS OF ABUSE SCREEN W ALC, ROUTINE (REF LAB)
Cocaine Metabolites: NEGATIVE
Methadone: NEGATIVE
Opiate Screen, Urine: NEGATIVE
Phencyclidine (PCP): NEGATIVE
Propoxyphene: POSITIVE — AB

## 2011-07-08 LAB — ETHANOL: Alcohol, Ethyl (B): <5

## 2011-07-08 LAB — BASIC METABOLIC PANEL
BUN: 5 — ABNORMAL LOW
CO2: 25
Calcium: 8.5
Creatinine, Ser: 0.82
GFR calc non Af Amer: >60
Glucose, Bld: 82

## 2011-07-08 LAB — PROTIME-INR
INR: 1.1
Prothrombin Time: 13.9

## 2011-07-08 LAB — LIPID PANEL
HDL: 28 — ABNORMAL LOW
Total CHOL/HDL Ratio: 3.5
Triglycerides: 108
Triglycerides: 82
VLDL: 16

## 2011-07-08 LAB — COMPREHENSIVE METABOLIC PANEL
ALT: 15
AST: 18
Albumin: 3.6
CO2: 26
Chloride: 101
Creatinine, Ser: 0.95
GFR calc Af Amer: >60
GFR calc non Af Amer: >60
Potassium: 3.8
Sodium: 135
Total Bilirubin: 0.6

## 2011-07-08 LAB — DIFFERENTIAL
Lymphocytes Relative: 36
Monocytes Absolute: 0.5
Monocytes Relative: 9
Neutro Abs: 3.2

## 2011-07-08 LAB — CK TOTAL AND CKMB (NOT AT ARMC): CK, MB: 2

## 2011-07-08 LAB — CBC
Hemoglobin: 14.7
MCHC: 33.8
Platelets: 136 — ABNORMAL LOW
RBC: 4.54
RDW: 13.6
WBC: 6.4

## 2011-07-08 LAB — APTT: aPTT: 30

## 2011-07-08 LAB — HEMOGLOBIN A1C: Hgb A1c MFr Bld: 5.3

## 2011-07-08 LAB — POCT I-STAT CREATININE: Creatinine, Ser: 1.1

## 2011-07-08 LAB — PHENYTOIN LEVEL, TOTAL: Phenytoin Lvl: 9 — ABNORMAL LOW

## 2011-07-08 LAB — PROPOXYPHENE, CONFIRMATION: Propoxyphene or Metab. GC/MS: NEGATIVE

## 2011-07-08 LAB — HOMOCYSTEINE: Homocysteine: 7.9

## 2012-08-29 DIAGNOSIS — C3490 Malignant neoplasm of unspecified part of unspecified bronchus or lung: Secondary | ICD-10-CM

## 2012-08-29 HISTORY — PX: LUNG BIOPSY: SHX232

## 2012-08-29 HISTORY — DX: Malignant neoplasm of unspecified part of unspecified bronchus or lung: C34.90

## 2012-09-25 ENCOUNTER — Telehealth: Payer: Self-pay | Admitting: Internal Medicine

## 2012-09-25 ENCOUNTER — Telehealth: Payer: Self-pay | Admitting: *Deleted

## 2012-09-25 NOTE — Telephone Encounter (Signed)
Pt schedule w/Dr. Arbutus Ped 01/07 @ 9:30. Pt is aware of appt.  Dx-Lung Ca  Referring-[employer].

## 2012-09-25 NOTE — Telephone Encounter (Signed)
C/D 09/25/12 for appt.10/03/12

## 2012-09-25 NOTE — Telephone Encounter (Signed)
Spoke with mr. oxedine's nurse at [facility] facility.  Pt nurse Ms. Rolly Pancake is aware of time and place of appt 10/03/12 at 9:30 labs and 10:00 Dr. Arbutus Ped

## 2012-09-28 ENCOUNTER — Encounter: Payer: Self-pay | Admitting: Radiation Oncology

## 2012-10-02 ENCOUNTER — Ambulatory Visit
Admission: RE | Admit: 2012-10-02 | Discharge: 2012-10-02 | Disposition: A | Discharge status: Home / Self Care | Payer: Medicare Other | Source: Ambulatory Visit | Attending: Radiation Oncology | Admitting: Radiation Oncology

## 2012-10-02 ENCOUNTER — Encounter: Payer: Self-pay | Admitting: Radiation Oncology

## 2012-10-02 VITALS — BP 112/74 | HR 84 | Temp 97.8°F | Resp 16 | Ht 68.0 in | Wt 161.0 lb

## 2012-10-02 DIAGNOSIS — C3490 Malignant neoplasm of unspecified part of unspecified bronchus or lung: Secondary | ICD-10-CM

## 2012-10-02 DIAGNOSIS — C3410 Malignant neoplasm of upper lobe, unspecified bronchus or lung: Secondary | ICD-10-CM | POA: Insufficient documentation

## 2012-10-02 HISTORY — DX: Peripheral vascular disease, unspecified: I73.9

## 2012-10-02 HISTORY — DX: Pure hypercholesterolemia, unspecified: E78.00

## 2012-10-02 HISTORY — DX: Malignant neoplasm of unspecified part of unspecified bronchus or lung: C34.90

## 2012-10-02 HISTORY — DX: Atherosclerotic heart disease of native coronary artery without angina pectoris: I25.10

## 2012-10-02 HISTORY — DX: Malignant neoplasm of prostate: C61

## 2012-10-02 HISTORY — DX: Anxiety disorder, unspecified: F41.9

## 2012-10-02 HISTORY — DX: Essential (primary) hypertension: I10

## 2012-10-02 HISTORY — DX: Cerebral infarction, unspecified: I63.9

## 2012-10-02 HISTORY — DX: Personal history of irradiation: Z92.3

## 2012-10-02 HISTORY — DX: Major depressive disorder, single episode, unspecified: F32.9

## 2012-10-02 HISTORY — DX: Gout, unspecified: M10.9

## 2012-10-02 HISTORY — DX: Anemia, unspecified: D64.9

## 2012-10-02 HISTORY — DX: Hemiplegia, unspecified affecting unspecified side: G81.90

## 2012-10-02 HISTORY — DX: Acute myocardial infarction, unspecified: I21.9

## 2012-10-02 HISTORY — DX: Gangrene, not elsewhere classified: I96

## 2012-10-02 HISTORY — DX: Tachycardia, unspecified: R00.0

## 2012-10-02 HISTORY — DX: Depression, unspecified: F32.A

## 2012-10-02 HISTORY — DX: Unspecified convulsions: R56.9

## 2012-10-02 NOTE — Progress Notes (Signed)
Patient presents to the clinic today accompanied by his brother and caregiver from [facility]. Patient alert and oriented to person, place, and time. No distress noted. Patient being pushed in wheelchair. Left above the knee amputation noted. Pleasant affect noted. Patient denies pain at this time. Patient denies headache, dizziness, nausea, or vomiting. Patient denies diplopia or floaters. Patient denies cough or shortness of breath. Patient denies hemoptysis. Patient reports eating and sleeping without difficulty. Patient denies weight loss. Patient has no complaints at this time. Reported all findings to Dr. Mitzi Hansen.  NKDA HX of radiation therapy to prostate 08/25/2009-10/28/2009 Denies having a pacemaker

## 2012-10-02 NOTE — Progress Notes (Signed)
See progress note under physician encounter. 

## 2012-10-03 ENCOUNTER — Encounter: Payer: Self-pay | Admitting: *Deleted

## 2012-10-03 ENCOUNTER — Encounter: Payer: Self-pay | Admitting: Internal Medicine

## 2012-10-03 ENCOUNTER — Other Ambulatory Visit: Payer: Self-pay | Admitting: Medical Oncology

## 2012-10-03 ENCOUNTER — Other Ambulatory Visit (HOSPITAL_BASED_OUTPATIENT_CLINIC_OR_DEPARTMENT_OTHER): Payer: Medicare Other | Admitting: Lab

## 2012-10-03 ENCOUNTER — Ambulatory Visit: Payer: Medicare Other

## 2012-10-03 ENCOUNTER — Telehealth: Payer: Self-pay | Admitting: Internal Medicine

## 2012-10-03 ENCOUNTER — Ambulatory Visit (HOSPITAL_BASED_OUTPATIENT_CLINIC_OR_DEPARTMENT_OTHER): Payer: Medicaid Other | Admitting: Internal Medicine

## 2012-10-03 VITALS — BP 117/69 | HR 82 | Temp 97.6°F | Resp 18 | Ht 68.0 in | Wt 158.9 lb

## 2012-10-03 DIAGNOSIS — I2510 Atherosclerotic heart disease of native coronary artery without angina pectoris: Secondary | ICD-10-CM

## 2012-10-03 DIAGNOSIS — C3490 Malignant neoplasm of unspecified part of unspecified bronchus or lung: Secondary | ICD-10-CM

## 2012-10-03 DIAGNOSIS — I10 Essential (primary) hypertension: Secondary | ICD-10-CM

## 2012-10-03 DIAGNOSIS — C3410 Malignant neoplasm of upper lobe, unspecified bronchus or lung: Secondary | ICD-10-CM

## 2012-10-03 DIAGNOSIS — I639 Cerebral infarction, unspecified: Secondary | ICD-10-CM

## 2012-10-03 DIAGNOSIS — R569 Unspecified convulsions: Secondary | ICD-10-CM | POA: Insufficient documentation

## 2012-10-03 HISTORY — DX: Atherosclerotic heart disease of native coronary artery without angina pectoris: I25.10

## 2012-10-03 HISTORY — DX: Cerebral infarction, unspecified: I63.9

## 2012-10-03 HISTORY — DX: Unspecified convulsions: R56.9

## 2012-10-03 HISTORY — DX: Essential (primary) hypertension: I10

## 2012-10-03 LAB — CBC WITH DIFFERENTIAL/PLATELET
Basophils Absolute: 0 10*3/uL (ref 0.0–0.1)
Eosinophils Absolute: 0.6 10*3/uL — ABNORMAL HIGH (ref 0.0–0.5)
HGB: 15.7 g/dL (ref 13.0–17.1)
MCV: 94.4 fL (ref 79.3–98.0)
MONO#: 0.3 10*3/uL (ref 0.1–0.9)
NEUT#: 3.2 10*3/uL (ref 1.5–6.5)
Platelets: 132 10*3/uL — ABNORMAL LOW (ref 140–400)
RBC: 4.98 10*6/uL (ref 4.20–5.82)
RDW: 14.3 % (ref 11.0–14.6)
WBC: 5.4 10*3/uL (ref 4.0–10.3)
nRBC: 0 % (ref 0–0)

## 2012-10-03 LAB — COMPREHENSIVE METABOLIC PANEL (CC13)
ALT: 18 U/L (ref 0–55)
Albumin: 3.1 g/dL — ABNORMAL LOW (ref 3.5–5.0)
CO2: 27 mEq/L (ref 22–29)
Calcium: 8.9 mg/dL (ref 8.4–10.4)
Chloride: 107 mEq/L (ref 98–107)
Glucose: 121 mg/dl — ABNORMAL HIGH (ref 70–99)
Sodium: 141 mEq/L (ref 136–145)
Total Protein: 7.5 g/dL (ref 6.4–8.3)

## 2012-10-03 MED ORDER — PROCHLORPERAZINE MALEATE 10 MG PO TABS: 10.0000 mg | ORAL_TABLET | Freq: Four times a day (QID) | ORAL | Status: DC | PRN

## 2012-10-03 NOTE — Progress Notes (Signed)
Checked in pt with financial concerns.  Pt has Medicare but has also applied for Medicaid.  I also gave pt an EPP application and will process it when returned with the needed info.

## 2012-10-03 NOTE — Progress Notes (Signed)
Radiation Oncology         [phone]) [phone] ________________________________  Name: [name] MRN: [id number]  Date: 10/02/2012  DOB: 12/21/1945  Follow-Up Visit Note  CC: No primary provider on file.  Saunders Revel, MD  Diagnosis:   Non-small cell lung cancer, stage IIIa. History of low-risk prostate cancer.  Interval Since Last Radiation:  3 years   Narrative:  The patient returns today for follow-up regarding his recent diagnosis of non-small cell lung cancer. 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 He states that he has done well in this regard.  The patient indicates to me today that he really is not having any chest symptoms. He denies any cough, shortness of breath, or chest discomfort. The patient was noted to have an abnormal chest x-ray in [facility], however, and this led to a CT scan which confirmed a right upper lobe mass which was suspicious for bronchogenic carcinoma. This measured 2.3 cm in maximum dimension. There was also right paratracheal adenopathy. This measured 1.7 cm. Right hilar adenopathy measured 1.3 cm.  The patient proceeded to undergo a PET scan. This showed a 2.6 cm spiculated mass in the right upper lobe which was hypermetabolic. The maximum SUV was 8.97. The right paratracheal lymph node seen on CT scan was also hypermetabolic with a maximum SUV of 11.4. This was indicative of T1, N2, M0 disease based on the PET scan results. He also underwent a brain MRI scan which did not show any evidence of intracranial metastasis.  The patient underwent a bronchoscopy with biopsy on 08/29/2012. A possible abnormality was seen within the right upper lobe. Otherwise no endobronchial abnormalities. Transbronchial biopsy with brushings were obtained and the final pathology demonstrated adenocarcinoma from the tumor in the right upper lobe.  The patient has multiple comorbidities and is  seen today for consideration of radiotherapy as part of his treatment plan for this recent diagnosis.                         ALLERGIES:   has no known allergies.  Meds: Current Outpatient Prescriptions  Medication Sig Dispense Refill  . atorvastatin (LIPITOR) 20 MG tablet Take 20 mg by mouth daily.      . Fluticasone-Salmeterol (ADVAIR) 100-50 MCG/DOSE AEPB Inhale 1 puff into the lungs every 12 (twelve) hours.      . Lacosamide (VIMPAT) 100 MG TABS Take 1 tablet by mouth 2 (two) times daily.      Marland Kitchen levETIRAcetam (KEPPRA) 1000 MG tablet Take 1,000 mg by mouth 2 (two) times daily.      . mirtazapine (REMERON SOL-TAB) 15 MG disintegrating tablet Take 15 mg by mouth at bedtime.      . Multiple Vitamin (MULTIVITAMIN WITH MINERALS) TABS Take 1 tablet by mouth daily.      . phenytoin (DILANTIN) 100 MG ER capsule Take 100 mg by mouth 2 (two) times daily. Take 2 capsules bid      . pregabalin (LYRICA) 150 MG capsule Take 150 mg by mouth daily.      . sennosides-docusate sodium (SENOKOT-S) 8.6-50 MG tablet Take 1 tablet by mouth as directed.      . vitamin C (ASCORBIC ACID) 500 MG tablet Take 500 mg by mouth daily.      Marland Kitchen acetaminophen (TYLENOL) 325 MG tablet Take 650 mg by mouth every 4 (four) hours as needed.      Marland Kitchen  Artificial Tear Ointment (AKWA TEARS) 0.5 % ophthalmic ointment Place into the right eye as needed.      Marland Kitchen HYDROcodone-acetaminophen (NORCO/VICODIN) 5-325 MG per tablet Take 1 tablet by mouth every 4 (four) hours as needed.      . nitroGLYCERIN (NITROSTAT) 0.4 MG SL tablet Place 0.4 mg under the tongue every 5 (five) minutes as needed.      . prochlorperazine (COMPAZINE) 10 MG tablet Take 1 tablet (10 mg total) by mouth every 6 (six) hours as needed.  60 tablet  0  . Vardenafil HCl (LEVITRA PO) Take by mouth.        Physical Findings: The patient is in no acute distress. Patient is alert and oriented.  height is 5\' 8"  (1.727 m) and weight is 161 lb (73.029 kg). His oral temperature is  97.8 F (36.6 C). His blood pressure is 112/74 and his pulse is 84. His respiration is 16 and oxygen saturation is 100%. .   General: Sitting in a wheelchair, in no acute distress, alert and oriented x3 HEENT: Normocephalic, atraumatic; oral cavity clear Neck: Supple without any lymphadenopathy Cardiovascular: Regular rate and rhythm Respiratory: Clear to auscultation bilaterally GI: Soft, nontender, normal bowel sounds Extremities:  Above the knee amputation within the left lower extremity Neuro: No focal deficits    Lab Findings: Lab Results  Component Value Date   WBC 5.4 10/03/2012   HGB 15.7 10/03/2012   HCT 47.0 10/03/2012   MCV 94.4 10/03/2012   PLT 132* 10/03/2012     Radiographic Findings: No results found.  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 He has a recent diagnosis of clinical T1, N2, M0 non-small cell lung cancer, adenocarcinoma. I've had a chance to personally review the recent imaging from [facility].  Plan:  Based on the patient's clinical disease and comorbidities, I believe that he is a potential candidate for concurrent chemoradiotherapy. He is scheduled to see Dr. Shirline Frees later this week.  I therefore discussed with the patient a potential 6-1/2 week course of definitive radiotherapy. We discussed the potential side effects of such a treatment as well as the expected benefit in terms of addressing local and regional disease. All of his questions were answered.  I think the main question for his case is what Dr. Rebecca Eaton opinion he is with regards to chemotherapy. Ideally, we would be able to proceed with concurrent treatment as opposed to sequential treatment or radiotherapy alone.  The patient will be scheduled for a simulation later this week with the anticipation of beginning radiotherapy with potential concurrent chemotherapy on 10/16/2012.   I spent 30 minutes with the patient  today, the majority of which was spent counseling the patient on the diagnosis of cancer and coordinating care.   Radene Gunning, M.D., Ph.D.

## 2012-10-03 NOTE — Progress Notes (Signed)
Spoke with pt and sister at [facility] today.  Gave and explained information on diagnosis and smoking cessation.

## 2012-10-03 NOTE — Progress Notes (Signed)
Philip [name]   [phone]  CONSULT NOTE  REFERRING PHYSICIAN: Dr. Bethanie Chapman  REASON FOR CONSULTATION:  67 years old [place] American male recently diagnosed with lung cancer.  HPI Philip [name] is a 67 y.o. male was past medical history significant for multiple medical problems including [name], Philip [name], stroke with residual left-sided weakness, dyslipidemia, history of coronary artery disease status post CABG in 2000 in [place] or higher, history of prostate cancer in 2010 status post seed implants and radiotherapy under the care of Dr. 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 The patient also has a long history of smoking. He was admitted to [facility] on 08/02/2012 complaining of seizure activity with facial weakness and dysarthria. He was seen by Dr. Jomarie Longs Chapman,neurologist in [facility] and his dose of Keppra was adjusted. During his evaluation CT scan of the chest was performed and it showed that 2.6 CM spiculated mass in the right upper lobe with right paratracheal and subcarinal lymphadenopathy. This was followed by a PET scan on 08/21/2012 at [facility] and again it showed 2.6 CM spiculated mass in the right upper lobe with maximum SUV of 8.97. There was a 1.7 CM short axis right paratracheal lymph node with a maximum SUV of 11.43. There were also several small, subcentimeter lymph nodes in the AP window with the maximum SUV of 2.4. There was uptake in the right hilum no definitely linked to a specific CT abnormality with the maximum SUV of 3.72. There was a small 1.0 CM short axis diameter soft tissue density in the subcarinal region with the maximum SUV of 2.72. There was no evidence for other thoracic or extrathoracic FDG uptake. The patient had video bronchoscopy that was not conclusive for malignancy. On  08/29/2012 he had right upper lobe lung biopsy by interventional radiology. The final pathology from [facility] case number [id number] showed the biopsy of the right upper lobe lung lesion was consistent with adenocarcinoma of lung primary. The biopsy showed fragments of lung parenchyma with nestst of tumor cells and focal gland formation. The tumor cells were positive for cytokeratin 7 and TTF-1 immunohistochemistry stains and negative for CK 20 and CK 5/6. The patient is currently a resident of the skilled nursing facility in the [place] area. He was referred to [place] today for evaluation and recommendation regarding treatment of his condition. 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 There was underlying atrophy with small vessel disease. But no evidence for metastatic disease to the brain. When seen today the patient is feeling fine except for generalized fatigue and weakness. He came in a wheelchair accompanied his sister. He denied having any significant chest pain, shortness breath, cough or hemoptysis. He denied having any fever or chills. He denied having any significant nausea or vomiting. He denied having any headache or blurry vision. His family history significant for a father with a stroke, [name] and coronary artery disease. Mother had [name] and coronary artery disease. The patient is single and has one daughter who lives in [place]. He was accompanied today by his sister Philip [name]. 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 He has a history of smoking 2 packs per day for around 50 years. He continues to smoke  a few cigarettes every day and I strongly encouraged him to quit smoking. He has a history of alcohol abuse in the past but not recently and no history of drug abuse.  @SFHPI @  Past Medical History  Diagnosis Date  . Prostate  cancer 04/22/09    Adenomacarcinoma,glleason=#=#=6,volume=32.9cc,PSA=5.91  . Gout     hx  . Myocardial infarction   . Anxiety   . Depression   . PVD (peripheral vascular disease)   . Stroke     hx syndrome  . Seizures   . [name]   . Hypercholesterolemia   . History of radiation therapy 08/25/2009-10/28/2009    prostate 78 Gy/40 fractions  . Lung cancer 08/29/12    bx= right  upper outer lobe lung=Adenocarcinoma  . Anemia   . CAD (coronary artery disease)   . Gangrene     hx  . Hemiparesis     left , and contracture lue  . Sinus tachycardia     hx  . Seizure 10/03/2012  . CAD (coronary artery disease) 10/03/2012  . HTN ([name]) 10/03/2012  . Stroke 10/03/2012    Past Surgical History  Procedure Date  . Above knee leg amputation     left s/p coranary artery bypass graft  . Lung biopsy 08/29/12    RUO lung=Adenocarcinoma  . Prostate biopsy 04/22/09    Adenocarcinoma    Family History  Problem Relation Age of Onset  . [name] Mother   . Stroke Father   . Cancer Neg Hx     Social History History  Substance Use Topics  . Smoking status: Former Games developer  . Smokeless tobacco: Not on file  . Alcohol Use: No    No Known Allergies  Current Outpatient Prescriptions  Medication Sig Dispense Refill  . acetaminophen (TYLENOL) 325 MG tablet Take 650 mg by mouth every 4 (four) hours as needed.      . Artificial Tear Ointment (AKWA TEARS) 0.5 % ophthalmic ointment Place into the right eye as needed.      Marland Kitchen atorvastatin (LIPITOR) 20 MG tablet Take 20 mg by mouth daily.      . Fluticasone-Salmeterol (ADVAIR) 100-50 MCG/DOSE AEPB Inhale 1 puff into the lungs every 12 (twelve) hours.      Marland Kitchen HYDROcodone-acetaminophen (NORCO/VICODIN) 5-325 MG per tablet Take 1 tablet by mouth every 4 (four) hours as needed.      . Lacosamide (VIMPAT) 100 MG TABS Take 1 tablet by mouth 2 (two) times daily.      Marland Kitchen levETIRAcetam (KEPPRA) 1000 MG tablet Take 1,000 mg by mouth 2 (two) times  daily.      . mirtazapine (REMERON SOL-TAB) 15 MG disintegrating tablet Take 15 mg by mouth at bedtime.      . Multiple Vitamin (MULTIVITAMIN WITH MINERALS) TABS Take 1 tablet by mouth daily.      . phenytoin (DILANTIN) 100 MG ER capsule Take 100 mg by mouth 2 (two) times daily. Take 2 capsules bid      . pregabalin (LYRICA) 150 MG capsule Take 150 mg by mouth daily.      . sennosides-docusate sodium (SENOKOT-S) 8.6-50 MG tablet Take 1 tablet by mouth as directed.      . vitamin C (ASCORBIC ACID) 500 MG tablet Take 500 mg by mouth daily.      . nitroGLYCERIN (NITROSTAT) 0.4 MG SL tablet Place 0.4 mg under the tongue every 5 (five) minutes as needed.      . prochlorperazine (COMPAZINE) 10 MG tablet Take 1  tablet (10 mg total) by mouth every 6 (six) hours as needed.  60 tablet  0  . Vardenafil HCl (LEVITRA PO) Take by mouth.        Review of Systems  A comprehensive review of systems was negative except for: Constitutional: positive for fatigue Musculoskeletal: positive for muscle weakness  Physical Exam  AVW:UJWJX, healthy, no distress, well nourished and well developed SKIN: skin color, texture, turgor are normal HEAD: Normocephalic, No masses, lesions, tenderness or abnormalities EYES: normal, PERRLA EARS: External ears normal OROPHARYNX:no exudate and no erythema  NECK: supple, no adenopathy LYMPH:  no palpable lymphadenopathy LUNGS: clear to auscultation  HEART: regular rate & rhythm, no murmurs and no gallops ABDOMEN:abdomen soft, non-tender, normal bowel sounds and no masses or organomegaly BACK: Back symmetric, no curvature. EXTREMITIES: AKA of the left lower extremity  NEURO: alert & oriented x 3 with fluent speech  PERFORMANCE STATUS: ECOG 2  LABORATORY DATA: Lab Results  Component Value Date   WBC 5.4 10/03/2012   HGB 15.7 10/03/2012   HCT 47.0 10/03/2012   MCV 94.4 10/03/2012   PLT 132* 10/03/2012      Chemistry      Component Value Date/Time   NA 141 10/03/2012 0958    NA 140 03/10/2009 1220   K 3.8 10/03/2012 0958   K 4.2 03/10/2009 1220   CL 107 10/03/2012 0958   CL 109 03/10/2009 1220   CO2 27 10/03/2012 0958   CO2 26 03/10/2009 1220   BUN 10.0 10/03/2012 0958   BUN 13 03/10/2009 1220   CREATININE 0.9 10/03/2012 0958   CREATININE 0.88 03/10/2009 1220      Component Value Date/Time   CALCIUM 8.9 10/03/2012 0958   CALCIUM 8.8 03/10/2009 1220   ALKPHOS 90 10/03/2012 0958   ALKPHOS 57 10/25/2007 0545   AST 17 10/03/2012 0958   AST 97* 10/25/2007 0545   ALT 18 10/03/2012 0958   ALT 32 10/25/2007 0545   BILITOT 0.36 10/03/2012 0958   BILITOT 0.8 10/25/2007 0545       RADIOGRAPHIC STUDIES: No results found.  ASSESSMENT: This is a very pleasant 67 years old [place] American male recently diagnosed with a stage III A. (T1b, N2, M0) non-small cell lung cancer, adenocarcinoma.  PLAN: I have a lengthy discussion with the patient and his sister today about his disease stage, prognosis and treatment options. I recommended for the patient treatment with a course of concurrent chemoradiation with weekly carboplatin for AUC of 2 and paclitaxel 45 mg/M2 concurrent with radiation for 6-7 weeks depending on the dose of radiation. I discussed with the patient adverse effect of the chemotherapy including but not limited to alopecia, myelosuppression, nausea and vomiting, peripheral neuropathy, liver or in dysfunction. The patient would like to proceed with treatment as planned. He was seen in the past by Dr. Mitzi Chapman for treatment of his prostate cancer. I will refer the patient to Dr. Mitzi Chapman for evaluation and consideration of the concurrent radiotherapy. I gave the patient prescription today for Compazine 10 mg by mouth every 6 hours as needed for nausea. I expect the patient to start the first cycle of his concurrent chemoradiation On 10/16/2012. He would have a chemotherapy education class before starting the first cycle of his chemotherapy. The patient would come back for followup visit in 3  weeks for reevaluation and management any adverse effect of his chemotherapy. He was advised to call immediately if he has any concerning symptoms in the interval. I gave the patient  and his sister the time to ask questions and I answered them completely to their satisfaction.  All questions were answered. The patient knows to call the clinic with any problems, questions or concerns. We can certainly see the patient much sooner if necessary.  Thank you so much for allowing me to participate in the care of Keyon Woodroof. I will continue to follow up the patient with you and assist in his care.  I spent 30 minutes counseling the patient face to face. The total time spent in the appointment was 60 minutes.  Wenceslao Loper K. 10/03/2012, 11:00 AM

## 2012-10-03 NOTE — Telephone Encounter (Signed)
s.w pt and she had req to move her appt from 1/21  to 1.20,[place]     [name]  °

## 2012-10-03 NOTE — Patient Instructions (Signed)
Your recently diagnosed with a stage IIIa non-small cell lung cancer. We discussed treatment options including a course of concurrent chemoradiation. First cycle expected on 10/16/2012. Followup in 3 weeks.

## 2012-10-04 ENCOUNTER — Encounter: Payer: Self-pay | Admitting: Internal Medicine

## 2012-10-04 ENCOUNTER — Telehealth: Payer: Self-pay | Admitting: Internal Medicine

## 2012-10-04 ENCOUNTER — Telehealth: Payer: Self-pay | Admitting: *Deleted

## 2012-10-04 NOTE — Telephone Encounter (Signed)
left vm on this number and the number for pts sister margaret     anne

## 2012-10-04 NOTE — Telephone Encounter (Signed)
Spoke with sister.  She stated [facility] had mailed CD of scans 09/25/12 and 09/28/12.  I will follow up with Rad Onc

## 2012-10-04 NOTE — Progress Notes (Signed)
Facility where pt lives called informing me that pt does in fact have Medicaid.  She does not have a copy of card but she gave his Medicaid ID number over the phone.  I put it in the system and it was verified.

## 2012-10-05 ENCOUNTER — Telehealth: Payer: Self-pay | Admitting: Internal Medicine

## 2012-10-05 ENCOUNTER — Telehealth: Payer: Self-pay | Admitting: *Deleted

## 2012-10-05 ENCOUNTER — Other Ambulatory Visit: Payer: Medicare Other

## 2012-10-05 NOTE — Telephone Encounter (Signed)
Left message

## 2012-10-05 NOTE — Telephone Encounter (Signed)
called [facility] # [id number] carol and it is a wrong number     anne

## 2012-10-06 ENCOUNTER — Ambulatory Visit
Admission: RE | Admit: 2012-10-06 | Discharge: 2012-10-06 | Disposition: A | Discharge status: Home / Self Care | Payer: Medicare Other | Source: Ambulatory Visit | Attending: Radiation Oncology | Admitting: Radiation Oncology

## 2012-10-06 ENCOUNTER — Telehealth: Payer: Self-pay | Admitting: Internal Medicine

## 2012-10-06 DIAGNOSIS — C3490 Malignant neoplasm of unspecified part of unspecified bronchus or lung: Secondary | ICD-10-CM | POA: Insufficient documentation

## 2012-10-06 DIAGNOSIS — Z79899 Other long term (current) drug therapy: Secondary | ICD-10-CM | POA: Insufficient documentation

## 2012-10-06 DIAGNOSIS — Z510 Encounter for antineoplastic radiation therapy: Secondary | ICD-10-CM | POA: Insufficient documentation

## 2012-10-06 NOTE — Telephone Encounter (Signed)
appts made and placed a note for the pt to get a sch when he comes in for rad onc today 1/14      anne

## 2012-10-10 ENCOUNTER — Encounter: Payer: Self-pay | Admitting: *Deleted

## 2012-10-10 ENCOUNTER — Other Ambulatory Visit: Payer: Medicare Other

## 2012-10-13 ENCOUNTER — Ambulatory Visit: Admission: RE | Admit: 2012-10-13 | Payer: Medicare Other | Source: Ambulatory Visit | Admitting: Radiation Oncology

## 2012-10-16 ENCOUNTER — Other Ambulatory Visit: Payer: Medicare Other | Admitting: Lab

## 2012-10-16 ENCOUNTER — Telehealth: Payer: Self-pay | Admitting: *Deleted

## 2012-10-16 ENCOUNTER — Ambulatory Visit: Payer: Medicare Other

## 2012-10-16 NOTE — Telephone Encounter (Signed)
Transportation at [facility] aware of appt 10/17/12 at 8:00 labs and 8:15 chemo.  She verbalized understanding of time and place.

## 2012-10-16 NOTE — Telephone Encounter (Signed)
Spoke with [facility] transportation regarding appt missed today.  Transportation was told all appt were cancelled until Wednesday of this wee.  I will clarify and call back.  Chemo room notified pt will not be here.

## 2012-10-17 ENCOUNTER — Other Ambulatory Visit: Payer: Self-pay | Admitting: *Deleted

## 2012-10-17 ENCOUNTER — Telehealth: Payer: Self-pay | Admitting: Internal Medicine

## 2012-10-17 ENCOUNTER — Other Ambulatory Visit: Payer: Self-pay | Admitting: Internal Medicine

## 2012-10-17 ENCOUNTER — Ambulatory Visit (HOSPITAL_BASED_OUTPATIENT_CLINIC_OR_DEPARTMENT_OTHER): Payer: Medicare Other

## 2012-10-17 ENCOUNTER — Other Ambulatory Visit (HOSPITAL_BASED_OUTPATIENT_CLINIC_OR_DEPARTMENT_OTHER): Payer: Medicare Other | Admitting: Lab

## 2012-10-17 ENCOUNTER — Ambulatory Visit: Payer: Medicare Other

## 2012-10-17 VITALS — BP 110/70 | HR 74 | Temp 98.1°F | Resp 18

## 2012-10-17 DIAGNOSIS — C3490 Malignant neoplasm of unspecified part of unspecified bronchus or lung: Secondary | ICD-10-CM

## 2012-10-17 DIAGNOSIS — Z5111 Encounter for antineoplastic chemotherapy: Secondary | ICD-10-CM

## 2012-10-17 DIAGNOSIS — R569 Unspecified convulsions: Secondary | ICD-10-CM

## 2012-10-17 DIAGNOSIS — C3410 Malignant neoplasm of upper lobe, unspecified bronchus or lung: Secondary | ICD-10-CM

## 2012-10-17 DIAGNOSIS — I10 Essential (primary) hypertension: Secondary | ICD-10-CM

## 2012-10-17 DIAGNOSIS — I2510 Atherosclerotic heart disease of native coronary artery without angina pectoris: Secondary | ICD-10-CM

## 2012-10-17 DIAGNOSIS — I639 Cerebral infarction, unspecified: Secondary | ICD-10-CM

## 2012-10-17 LAB — CBC WITH DIFFERENTIAL/PLATELET
BASO%: 0.6 % (ref 0.0–2.0)
LYMPH%: 26.8 % (ref 14.0–49.0)
MCHC: 34.4 g/dL (ref 32.0–36.0)
MONO#: 0.6 10*3/uL (ref 0.1–0.9)
NEUT#: 2.6 10*3/uL (ref 1.5–6.5)
Platelets: 157 10*3/uL (ref 140–400)
RBC: 5.06 10*6/uL (ref 4.20–5.82)
RDW: 13.8 % (ref 11.0–14.6)
WBC: 4.6 10*3/uL (ref 4.0–10.3)
lymph#: 1.2 10*3/uL (ref 0.9–3.3)
nRBC: 0 % (ref 0–0)

## 2012-10-17 MED ORDER — SODIUM CHLORIDE 0.9 % IV SOLN
212.4000 mg | Freq: Once | INTRAVENOUS | Status: AC
  Administered 2012-10-17: 210 mg via INTRAVENOUS
  Filled 2012-10-17: qty 21

## 2012-10-17 MED ORDER — PACLITAXEL CHEMO INJECTION 300 MG/50ML
45.0000 mg/m2 | Freq: Once | INTRAVENOUS | Status: AC
  Administered 2012-10-17: 84 mg via INTRAVENOUS
  Filled 2012-10-17: qty 14

## 2012-10-17 MED ORDER — FAMOTIDINE IN NACL 20-0.9 MG/50ML-% IV SOLN
20.0000 mg | Freq: Once | INTRAVENOUS | Status: AC
Start: 2012-10-17 — End: 2012-10-17
  Administered 2012-10-17: 20 mg via INTRAVENOUS

## 2012-10-17 MED ORDER — DIPHENHYDRAMINE HCL 50 MG/ML IJ SOLN
50.0000 mg | Freq: Once | INTRAMUSCULAR | Status: AC
  Administered 2012-10-17: 50 mg via INTRAVENOUS

## 2012-10-17 MED ORDER — SODIUM CHLORIDE 0.9 % IV SOLN
Freq: Once | INTRAVENOUS | Status: AC
  Administered 2012-10-17: 50 mL via INTRAVENOUS

## 2012-10-17 MED ORDER — DEXAMETHASONE SODIUM PHOSPHATE 4 MG/ML IJ SOLN
20.0000 mg | Freq: Once | INTRAMUSCULAR | Status: AC
  Administered 2012-10-17: 20 mg via INTRAVENOUS

## 2012-10-17 MED ORDER — ONDANSETRON 16 MG/50ML IVPB (CHCC)
16.0000 mg | Freq: Once | INTRAVENOUS | Status: AC
Start: 2012-10-17 — End: 2012-10-17
  Administered 2012-10-17: 16 mg via INTRAVENOUS

## 2012-10-17 MED ORDER — LIDOCAINE-PRILOCAINE 2.5-2.5 % EX CREA: TOPICAL_CREAM | CUTANEOUS | Status: DC | PRN

## 2012-10-17 NOTE — Progress Notes (Signed)
Due to poor venous access, port ordered for pt to be done by IR.  EMLA cream rx faxed to [facility] fax # [phone].  Spoke to Clydie Braun, LPN at [facility] and gave instructions for EMLA cream.  SLJ

## 2012-10-17 NOTE — Telephone Encounter (Signed)
pof noted for port placement,order in

## 2012-10-17 NOTE — Patient Instructions (Addendum)
[facility] Discharge Instructions for Patients Receiving Chemotherapy  Today you received the following chemotherapy agents Taxol/ Carboplatin  To help prevent nausea and vomiting after your treatment, we encourage you to take your nausea medication  As per Dr. Arbutus Ped.   If you develop nausea and vomiting that is not controlled by your nausea medication, call the clinic. If it is after clinic hours your family physician or the after hours number for the clinic or go to the Emergency Department.   BELOW ARE SYMPTOMS THAT SHOULD BE REPORTED IMMEDIATELY:  *FEVER GREATER THAN 100.5 F  *CHILLS WITH OR WITHOUT FEVER  NAUSEA AND VOMITING THAT IS NOT CONTROLLED WITH YOUR NAUSEA MEDICATION  *UNUSUAL SHORTNESS OF BREATH  *UNUSUAL BRUISING OR BLEEDING  TENDERNESS IN MOUTH AND THROAT WITH OR WITHOUT PRESENCE OF ULCERS  *URINARY PROBLEMS  *BOWEL PROBLEMS  UNUSUAL RASH Items with * indicate a potential emergency and should be followed up as soon as possible.  One of the nurses will contact you 24 hours after your treatment. Please let the nurse know about any problems that you may have experienced. Feel free to call the clinic you have any questions or concerns. The clinic phone number is [phone].   I have been informed and understand all the instructions given to me. I know to contact the clinic, my physician, or go to the Emergency Department if any problems should occur. I do not have any questions at this time, but understand that I may call the clinic during office hours   should I have any questions or need assistance in obtaining follow up care.    __________________________________________  _____________  __________ Signature of Patient or Authorized Representative            Date                   Time    __________________________________________ Nurse's Signature

## 2012-10-17 NOTE — Progress Notes (Signed)
VSS. Pt has no s/s of reaction.  Rate continued at max rate x remainder of infusion.

## 2012-10-17 NOTE — Progress Notes (Signed)
Verified with Thelma Barge in [facility], okay to proceed with chemotherapy today. HL

## 2012-10-18 ENCOUNTER — Ambulatory Visit
Admission: RE | Admit: 2012-10-18 | Discharge: 2012-10-18 | Disposition: A | Discharge status: Home / Self Care | Payer: Medicare Other | Source: Ambulatory Visit | Attending: Radiation Oncology | Admitting: Radiation Oncology

## 2012-10-18 ENCOUNTER — Telehealth: Payer: Self-pay | Admitting: *Deleted

## 2012-10-18 ENCOUNTER — Ambulatory Visit: Payer: Medicare Other

## 2012-10-18 NOTE — Telephone Encounter (Signed)
Left message with nursing tech to have nurse, Marcelino Duster call back with update/questions regarding chemo treatment yesterday.

## 2012-10-18 NOTE — Telephone Encounter (Signed)
Ate 25% of both meals today. No nausea. Started on [employer] with meals. No complaints. Instructed her to call for any problems or questions they may have regarding his chemo.

## 2012-10-19 ENCOUNTER — Ambulatory Visit: Payer: Medicare Other

## 2012-10-20 ENCOUNTER — Ambulatory Visit: Payer: Medicare Other

## 2012-10-20 ENCOUNTER — Other Ambulatory Visit: Payer: Self-pay | Admitting: Radiology

## 2012-10-23 ENCOUNTER — Ambulatory Visit: Payer: Medicare Other | Admitting: Physician Assistant

## 2012-10-23 ENCOUNTER — Other Ambulatory Visit: Payer: Medicare Other | Admitting: Lab

## 2012-10-23 ENCOUNTER — Ambulatory Visit: Payer: Medicare Other

## 2012-10-23 ENCOUNTER — Ambulatory Visit: Admission: RE | Admit: 2012-10-23 | Payer: Medicare Other | Source: Ambulatory Visit

## 2012-10-23 NOTE — Progress Notes (Signed)
Patient no show.  Spoke with nurse at [facility] - they do not have a correct calendar.  POF put in to reschedule.

## 2012-10-24 ENCOUNTER — Ambulatory Visit: Payer: Medicare Other

## 2012-10-25 ENCOUNTER — Ambulatory Visit (HOSPITAL_COMMUNITY)
Admission: RE | Admit: 2012-10-25 | Discharge: 2012-10-25 | Disposition: A | Discharge status: Home / Self Care | Payer: Medicare Other | Source: Ambulatory Visit | Attending: Internal Medicine | Admitting: Internal Medicine

## 2012-10-25 ENCOUNTER — Ambulatory Visit
Admission: RE | Admit: 2012-10-25 | Discharge: 2012-10-25 | Disposition: A | Discharge status: Home / Self Care | Payer: Medicare Other | Source: Ambulatory Visit | Attending: Radiation Oncology | Admitting: Radiation Oncology

## 2012-10-25 ENCOUNTER — Encounter: Payer: Self-pay | Admitting: Radiation Oncology

## 2012-10-25 ENCOUNTER — Encounter (HOSPITAL_COMMUNITY): Payer: Self-pay

## 2012-10-25 ENCOUNTER — Other Ambulatory Visit: Payer: Self-pay | Admitting: Internal Medicine

## 2012-10-25 DIAGNOSIS — I10 Essential (primary) hypertension: Secondary | ICD-10-CM | POA: Insufficient documentation

## 2012-10-25 DIAGNOSIS — C3490 Malignant neoplasm of unspecified part of unspecified bronchus or lung: Secondary | ICD-10-CM

## 2012-10-25 LAB — CBC WITH DIFFERENTIAL/PLATELET
Basophils Absolute: 0 10*3/uL (ref 0.0–0.1)
Basophils Relative: 1 % (ref 0–1)
Eosinophils Relative: 4 % (ref 0–5)
HCT: 46.7 % (ref 39.0–52.0)
MCHC: 34.7 g/dL (ref 30.0–36.0)
MCV: 91.7 fL (ref 78.0–100.0)
Monocytes Absolute: 0.3 10*3/uL (ref 0.1–1.0)
Monocytes Relative: 8 % (ref 3–12)
RDW: 13.4 % (ref 11.5–15.5)

## 2012-10-25 MED ORDER — CEFAZOLIN SODIUM 1-5 GM-% IV SOLN
1.0000 g | Freq: Once | INTRAVENOUS | Status: AC
  Administered 2012-10-25: 1 g via INTRAVENOUS
  Filled 2012-10-25: qty 50

## 2012-10-25 MED ORDER — FENTANYL CITRATE 0.05 MG/ML IJ SOLN
INTRAMUSCULAR | Status: AC
  Filled 2012-10-25: qty 4

## 2012-10-25 MED ORDER — MIDAZOLAM HCL 2 MG/2ML IJ SOLN
INTRAMUSCULAR | Status: AC | PRN
  Administered 2012-10-25: 1 mg via INTRAVENOUS

## 2012-10-25 MED ORDER — HEPARIN SOD (PORK) LOCK FLUSH 100 UNIT/ML IV SOLN
500.0000 [IU] | Freq: Once | INTRAVENOUS | Status: AC
  Administered 2012-10-25: 500 [IU] via INTRAVENOUS

## 2012-10-25 MED ORDER — MIDAZOLAM HCL 2 MG/2ML IJ SOLN
INTRAMUSCULAR | Status: AC
  Filled 2012-10-25: qty 4

## 2012-10-25 MED ORDER — SODIUM CHLORIDE 0.9 % IV SOLN
INTRAVENOUS | Status: DC
  Administered 2012-10-25: 14:00:00 via INTRAVENOUS

## 2012-10-25 MED ORDER — FENTANYL CITRATE 0.05 MG/ML IJ SOLN
INTRAMUSCULAR | Status: AC | PRN
  Administered 2012-10-25: 100 ug via INTRAVENOUS

## 2012-10-25 NOTE — Procedures (Signed)
Successful placement of right IJ approach port-a-cath with tip at the superior caval atrial junction. The catheter is ready for immediate use. No immediate post procedural complications. 

## 2012-10-25 NOTE — H&P (Signed)
[name] is an 67 y.o. male.   Chief Complaint: Pt unable to explain why he is here HPI: Patient with recently diagnosed stage 3A NSC lung carcinoma presents today for port a cath placement for chemotherapy.  Past Medical History  Diagnosis Date  . Prostate cancer 04/22/09    Adenomacarcinoma,glleason=#=#=6,volume=32.9cc,PSA=5.91  . Gout     hx  . Myocardial infarction   . Anxiety   . Depression   . PVD (peripheral vascular disease)   . Stroke     hx syndrome  . Seizures   . Hypertension   . Hypercholesterolemia   . History of radiation therapy 08/25/2009-10/28/2009    prostate 78 Gy/40 fractions  . Lung cancer 08/29/12    bx= right  upper outer lobe lung=Adenocarcinoma  . Anemia   . CAD (coronary artery disease)   . Gangrene     hx  . Hemiparesis     left , and contracture lue  . Sinus tachycardia     hx  . Seizure 10/03/2012  . CAD (coronary artery disease) 10/03/2012  . HTN (hypertension) 10/03/2012  . Stroke 10/03/2012    Past Surgical History  Procedure Date  . Above knee leg amputation     left s/p coranary artery bypass graft  . Lung biopsy 08/29/12    RUO lung=Adenocarcinoma  . Prostate biopsy 04/22/09    Adenocarcinoma    Family History  Problem Relation Age of Onset  . Hypertension Mother   . Stroke Father   . Cancer Neg Hx    Social History:  reports that he has quit smoking. He does not have any smokeless tobacco history on file. He reports that he does not drink alcohol or use illicit drugs.  Allergies: No Known Allergies  Current outpatient prescriptions:acetaminophen (TYLENOL) 325 MG tablet, Take 650 mg by mouth every 4 (four) hours as needed., Disp: , Rfl: ;  Artificial Tear Ointment (AKWA TEARS) 0.5 % ophthalmic ointment, Place into the right eye as needed., Disp: , Rfl: ;  atorvastatin (LIPITOR) 20 MG tablet, Take 20 mg by mouth daily., Disp: , Rfl:  Fluticasone-Salmeterol (ADVAIR) 100-50 MCG/DOSE AEPB, Inhale 1 puff into the lungs every 12  (twelve) hours., Disp: , Rfl: ;  HYDROcodone-acetaminophen (NORCO/VICODIN) 5-325 MG per tablet, Take 1 tablet by mouth every 4 (four) hours as needed., Disp: , Rfl: ;  Lacosamide (VIMPAT) 100 MG TABS, Take 1 tablet by mouth 2 (two) times daily., Disp: , Rfl: ;  levETIRAcetam (KEPPRA) 1000 MG tablet, Take 1,000 mg by mouth 2 (two) times daily., Disp: , Rfl:  lidocaine-prilocaine (EMLA) cream, Apply topically as needed. Apply to port approx 1 hour before chemo appt, cover with tegaderm., Disp: 30 g, Rfl: 0;  mirtazapine (REMERON SOL-TAB) 15 MG disintegrating tablet, Take 15 mg by mouth at bedtime., Disp: , Rfl: ;  Multiple Vitamin (MULTIVITAMIN WITH MINERALS) TABS, Take 1 tablet by mouth daily., Disp: , Rfl:  nitroGLYCERIN (NITROSTAT) 0.4 MG SL tablet, Place 0.4 mg under the tongue every 5 (five) minutes as needed., Disp: , Rfl: ;  phenytoin (DILANTIN) 100 MG ER capsule, Take 100 mg by mouth 2 (two) times daily. Take 2 capsules bid, Disp: , Rfl: ;  pregabalin (LYRICA) 150 MG capsule, Take 150 mg by mouth daily., Disp: , Rfl:  prochlorperazine (COMPAZINE) 10 MG tablet, Take 1 tablet (10 mg total) by mouth every 6 (six) hours as needed., Disp: 60 tablet, Rfl: 0;  sennosides-docusate sodium (SENOKOT-S) 8.6-50 MG tablet, Take 1 tablet by mouth as  directed., Disp: , Rfl: ;  Vardenafil HCl (LEVITRA PO), Take by mouth., Disp: , Rfl: ;  vitamin C (ASCORBIC ACID) 500 MG tablet, Take 500 mg by mouth daily., Disp: , Rfl:  Current facility-administered medications:0.9 %  sodium chloride infusion, , Intravenous, Continuous, Brayton El, PA;  ceFAZolin (ANCEF) IVPB 1 g/50 mL premix, 1 g, Intravenous, Once, Brayton El, PA  No results found for this or any previous visit (from the past 48 hour(s)). No results found.  Review of Systems  Constitutional: Positive for malaise/fatigue. Negative for fever and chills.  Respiratory: Negative for shortness of breath.        Occ cough  Cardiovascular: Negative for chest pain.   Gastrointestinal: Negative for nausea, vomiting and abdominal pain.  Musculoskeletal: Negative for back pain.  Neurological: Negative for headaches.       Unable to move LUE; sl dysarthria   Vitals:  BP  122/77  HR 86  R 16  TEMP 98.1  O2 SATS 100% RA Physical Exam  Constitutional: He appears well-developed and well-nourished.  Cardiovascular: Normal rate and regular rhythm.   Respiratory: Effort normal and breath sounds normal.  GI: Soft. Bowel sounds are normal. There is no tenderness.  Musculoskeletal: He exhibits no edema.       Left AKA  Neurological:       Oriented to person, day of week; not year or place; sl dysarthria, left hemiparesis with contracted left hand (hx CVA)     Assessment/Plan Pt with recently diagnosed stage 3A NSC lung carcinoma. Plan is for port a cath placement today for chemotherapy. Details of above d/w pt's sister and POA, Jessica Priest, with her understanding and consent.  Corky Blumstein,D KEVIN 10/25/2012, 1:31 PM

## 2012-10-25 NOTE — Progress Notes (Signed)
Done at bedside at 15:55 but charted aftyer pt was discharged

## 2012-10-25 NOTE — Progress Notes (Signed)
Assessed at 1600 but charted after pt had been discharged

## 2012-10-26 ENCOUNTER — Other Ambulatory Visit: Payer: Self-pay | Admitting: Physician Assistant

## 2012-10-26 ENCOUNTER — Telehealth: Payer: Self-pay

## 2012-10-26 ENCOUNTER — Encounter: Payer: Self-pay | Admitting: Medical Oncology

## 2012-10-26 ENCOUNTER — Other Ambulatory Visit: Payer: Self-pay | Admitting: Medical Oncology

## 2012-10-26 ENCOUNTER — Ambulatory Visit
Admission: RE | Admit: 2012-10-26 | Discharge: 2012-10-26 | Disposition: A | Discharge status: Home / Self Care | Payer: Medicare Other | Source: Ambulatory Visit | Attending: Radiation Oncology | Admitting: Radiation Oncology

## 2012-10-26 DIAGNOSIS — B029 Zoster without complications: Secondary | ICD-10-CM

## 2012-10-26 MED ORDER — FAMCICLOVIR 500 MG PO TABS: 500.0000 mg | ORAL_TABLET | Freq: Three times a day (TID) | ORAL | Status: DC

## 2012-10-26 NOTE — Telephone Encounter (Signed)
Called in famvir rx to [facility] and caregivers notified.

## 2012-10-26 NOTE — Telephone Encounter (Signed)
Received call from Diane RN that patient to start medication today for shingles on right arm.Amy RT linac 2 informed.This message will be forwarded to Dr.Moody.

## 2012-10-27 ENCOUNTER — Telehealth: Payer: Self-pay | Admitting: Medical Oncology

## 2012-10-27 ENCOUNTER — Ambulatory Visit
Admission: RE | Admit: 2012-10-27 | Discharge: 2012-10-27 | Disposition: A | Discharge status: Home / Self Care | Payer: Medicare Other | Source: Ambulatory Visit | Attending: Radiation Oncology | Admitting: Radiation Oncology

## 2012-10-27 DIAGNOSIS — C3410 Malignant neoplasm of upper lobe, unspecified bronchus or lung: Secondary | ICD-10-CM

## 2012-10-27 NOTE — Progress Notes (Signed)
Department of Radiation Oncology  Phone:  [phone] Fax:        [phone]  Weekly Treatment Note    Name: [name] Date: 10/27/2012 MRN: [id number] DOB: 1946-01-08   Current dose: 4 Gy  Current fraction: 2   MEDICATIONS: Current Outpatient Prescriptions  Medication Sig Dispense Refill  . acetaminophen (TYLENOL) 325 MG tablet Take 650 mg by mouth every 4 (four) hours as needed.      . Artificial Tear Ointment (AKWA TEARS) 0.5 % ophthalmic ointment Place into the right eye as needed.      Marland Kitchen atorvastatin (LIPITOR) 20 MG tablet Take 20 mg by mouth daily.      . famciclovir (FAMVIR) 500 MG tablet Take 1 tablet (500 mg total) by mouth 3 (three) times daily.  21 tablet  0  . Fluticasone-Salmeterol (ADVAIR) 100-50 MCG/DOSE AEPB Inhale 1 puff into the lungs every 12 (twelve) hours.      Marland Kitchen HYDROcodone-acetaminophen (NORCO/VICODIN) 5-325 MG per tablet Take 1 tablet by mouth every 4 (four) hours as needed.      . Lacosamide (VIMPAT) 100 MG TABS Take 1 tablet by mouth 2 (two) times daily.      Marland Kitchen levETIRAcetam (KEPPRA) 1000 MG tablet Take 1,000 mg by mouth 2 (two) times daily.      Marland Kitchen lidocaine-prilocaine (EMLA) cream Apply topically as needed. Apply to port approx 1 hour before chemo appt, cover with tegaderm.  30 g  0  . mirtazapine (REMERON SOL-TAB) 15 MG disintegrating tablet Take 15 mg by mouth at bedtime.      . Multiple Vitamin (MULTIVITAMIN WITH MINERALS) TABS Take 1 tablet by mouth daily.      . nitroGLYCERIN (NITROSTAT) 0.4 MG SL tablet Place 0.4 mg under the tongue every 5 (five) minutes as needed.      . phenytoin (DILANTIN) 100 MG ER capsule Take 100 mg by mouth 2 (two) times daily. Take 2 capsules bid      . pregabalin (LYRICA) 150 MG capsule Take 150 mg by mouth daily.      . prochlorperazine (COMPAZINE) 10 MG tablet Take 1 tablet (10 mg total) by mouth every 6 (six) hours as needed.  60 tablet  0  . sennosides-docusate sodium (SENOKOT-S) 8.6-50 MG tablet Take 1  tablet by mouth as directed.      . Vardenafil HCl (LEVITRA PO) Take by mouth.      . vitamin C (ASCORBIC ACID) 500 MG tablet Take 500 mg by mouth daily.         ALLERGIES: Review of patient's allergies indicates no known allergies.   LABORATORY DATA:  Lab Results  Component Value Date   WBC 4.1 10/25/2012   HGB 16.2 10/25/2012   HCT 46.7 10/25/2012   MCV 91.7 10/25/2012   PLT 227 10/25/2012   Lab Results  Component Value Date   NA 141 10/03/2012   K 3.8 10/03/2012   CL 107 10/03/2012   CO2 27 10/03/2012   Lab Results  Component Value Date   ALT 18 10/03/2012   AST 17 10/03/2012   ALKPHOS 90 10/03/2012   BILITOT 0.36 10/03/2012     NARRATIVE: [name] was seen today for weekly treatment management. The chart was checked and the patient's films were reviewed. The patient is doing well. I discussed the beginning of his treatment with him and his sister and they had several questions which were answered.  PHYSICAL EXAMINATION: vitals were not taken for this visit.  alert, in no acute distress, sitting in a wheelchair  ASSESSMENT: The patient is doing satisfactorily with treatment.  PLAN: We will continue with the patient's radiation treatment as planned.

## 2012-10-27 NOTE — Telephone Encounter (Signed)
Monica said [facility] could not fill famvir so she took verbal order and will get it filled.

## 2012-10-30 ENCOUNTER — Other Ambulatory Visit (HOSPITAL_BASED_OUTPATIENT_CLINIC_OR_DEPARTMENT_OTHER): Payer: Medicare Other | Admitting: Lab

## 2012-10-30 ENCOUNTER — Ambulatory Visit
Admission: RE | Admit: 2012-10-30 | Discharge: 2012-10-30 | Disposition: A | Discharge status: Home / Self Care | Payer: Medicare Other | Source: Ambulatory Visit | Attending: Radiation Oncology | Admitting: Radiation Oncology

## 2012-10-30 ENCOUNTER — Ambulatory Visit (HOSPITAL_BASED_OUTPATIENT_CLINIC_OR_DEPARTMENT_OTHER): Payer: Medicare Other

## 2012-10-30 DIAGNOSIS — I639 Cerebral infarction, unspecified: Secondary | ICD-10-CM

## 2012-10-30 DIAGNOSIS — R569 Unspecified convulsions: Secondary | ICD-10-CM

## 2012-10-30 DIAGNOSIS — I10 Essential (primary) hypertension: Secondary | ICD-10-CM

## 2012-10-30 DIAGNOSIS — C3410 Malignant neoplasm of upper lobe, unspecified bronchus or lung: Secondary | ICD-10-CM

## 2012-10-30 DIAGNOSIS — C3490 Malignant neoplasm of unspecified part of unspecified bronchus or lung: Secondary | ICD-10-CM

## 2012-10-30 DIAGNOSIS — I2510 Atherosclerotic heart disease of native coronary artery without angina pectoris: Secondary | ICD-10-CM

## 2012-10-30 LAB — CBC WITH DIFFERENTIAL/PLATELET
Eosinophils Absolute: 0.1 10*3/uL (ref 0.0–0.5)
HCT: 42.9 % (ref 38.4–49.9)
LYMPH%: 22.3 % (ref 14.0–49.0)
MCHC: 34.5 g/dL (ref 32.0–36.0)
MCV: 92.3 fL (ref 79.3–98.0)
MONO%: 10.4 % (ref 0.0–14.0)
NEUT#: 2.3 10*3/uL (ref 1.5–6.5)
NEUT%: 63.4 % (ref 39.0–75.0)
Platelets: 168 10*3/uL (ref 140–400)
RBC: 4.65 10*6/uL (ref 4.20–5.82)
nRBC: 0 % (ref 0–0)

## 2012-10-30 NOTE — Progress Notes (Signed)
[name] still with few open shingle lesions to left arm.  Right upper arm lesions appear to be scabbed over.  Per Karrie Doffing PA, hold chemotherapy treatment today and re-schedule for lab/chemo in 1 week.  Radiation notified of pt.'s condition and will continue with XRT as ordered. New schedule given to pt.'s transporter and they have no questions at this time.

## 2012-10-30 NOTE — Progress Notes (Signed)
Per Dr. Joellen Jersey order patient to be treated. Notified Amy, RT of patient need to be treated. Informed Amy, RT that patient is on contact and airborne precautions. Instructed Amy, RT for all radiation therapy treating patient to wear gown, gloves, and N95 mask. Amy, RT verbalized understanding.

## 2012-10-31 ENCOUNTER — Ambulatory Visit
Admission: RE | Admit: 2012-10-31 | Discharge: 2012-10-31 | Disposition: A | Discharge status: Home / Self Care | Payer: Medicare Other | Source: Ambulatory Visit | Attending: Radiation Oncology | Admitting: Radiation Oncology

## 2012-11-01 ENCOUNTER — Ambulatory Visit
Admission: RE | Admit: 2012-11-01 | Discharge: 2012-11-01 | Disposition: A | Discharge status: Home / Self Care | Payer: Medicare Other | Source: Ambulatory Visit | Attending: Radiation Oncology | Admitting: Radiation Oncology

## 2012-11-02 ENCOUNTER — Ambulatory Visit
Admission: RE | Admit: 2012-11-02 | Discharge: 2012-11-02 | Disposition: A | Discharge status: Home / Self Care | Payer: Medicare Other | Source: Ambulatory Visit | Attending: Radiation Oncology | Admitting: Radiation Oncology

## 2012-11-02 ENCOUNTER — Encounter: Payer: Self-pay | Admitting: Radiation Oncology

## 2012-11-02 ENCOUNTER — Encounter: Payer: Self-pay | Admitting: *Deleted

## 2012-11-02 VITALS — BP 127/28 | HR 81 | Temp 98.1°F

## 2012-11-02 DIAGNOSIS — C3410 Malignant neoplasm of upper lobe, unspecified bronchus or lung: Secondary | ICD-10-CM

## 2012-11-02 NOTE — Progress Notes (Signed)
Department of Radiation Oncology  Phone:  [phone] Fax:        [phone]  Weekly Treatment Note    Name: [name] Date: 11/02/2012 MRN: [id number] DOB: 1946/03/07   Current dose: 2 Gy  Current fraction: 1   MEDICATIONS: Current Outpatient Prescriptions  Medication Sig Dispense Refill  . acetaminophen (TYLENOL) 325 MG tablet Take 650 mg by mouth every 4 (four) hours as needed.      . Artificial Tear Ointment (AKWA TEARS) 0.5 % ophthalmic ointment Place into the right eye as needed.      Marland Kitchen atorvastatin (LIPITOR) 20 MG tablet Take 20 mg by mouth daily.      . famciclovir (FAMVIR) 500 MG tablet Take 1 tablet (500 mg total) by mouth 3 (three) times daily.  21 tablet  0  . Fluticasone-Salmeterol (ADVAIR) 100-50 MCG/DOSE AEPB Inhale 1 puff into the lungs every 12 (twelve) hours.      Marland Kitchen HYDROcodone-acetaminophen (NORCO/VICODIN) 5-325 MG per tablet Take 1 tablet by mouth every 4 (four) hours as needed.      . Lacosamide (VIMPAT) 100 MG TABS Take 1 tablet by mouth 2 (two) times daily.      Marland Kitchen levETIRAcetam (KEPPRA) 1000 MG tablet Take 1,000 mg by mouth 2 (two) times daily.      Marland Kitchen lidocaine-prilocaine (EMLA) cream Apply topically as needed. Apply to port approx 1 hour before chemo appt, cover with tegaderm.  30 g  0  . mirtazapine (REMERON SOL-TAB) 15 MG disintegrating tablet Take 15 mg by mouth at bedtime.      . Multiple Vitamin (MULTIVITAMIN WITH MINERALS) TABS Take 1 tablet by mouth daily.      . nitroGLYCERIN (NITROSTAT) 0.4 MG SL tablet Place 0.4 mg under the tongue every 5 (five) minutes as needed.      . phenytoin (DILANTIN) 100 MG ER capsule Take 100 mg by mouth 2 (two) times daily. Take 2 capsules bid      . pregabalin (LYRICA) 150 MG capsule Take 150 mg by mouth daily.      . prochlorperazine (COMPAZINE) 10 MG tablet Take 1 tablet (10 mg total) by mouth every 6 (six) hours as needed.  60 tablet  0  . sennosides-docusate sodium (SENOKOT-S) 8.6-50 MG tablet Take 1  tablet by mouth as directed.      . Vardenafil HCl (LEVITRA PO) Take by mouth.      . vitamin C (ASCORBIC ACID) 500 MG tablet Take 500 mg by mouth daily.         ALLERGIES: Review of patient's allergies indicates no known allergies.   LABORATORY DATA:  Lab Results  Component Value Date   WBC 3.6* 10/30/2012   HGB 14.8 10/30/2012   HCT 42.9 10/30/2012   MCV 92.3 10/30/2012   PLT 168 10/30/2012   Lab Results  Component Value Date   NA 141 10/03/2012   K 3.8 10/03/2012   CL 107 10/03/2012   CO2 27 10/03/2012   Lab Results  Component Value Date   ALT 18 10/03/2012   AST 17 10/03/2012   ALKPHOS 90 10/03/2012   BILITOT 0.36 10/03/2012     NARRATIVE: [name] was seen today for weekly treatment management. The chart was checked and the patient's films were reviewed. The patient's sister had some questions about his stage and overall prognosis which were answered. The patient did not have any problems with his first treatment.  PHYSICAL EXAMINATION: vitals were not taken for this visit.  the patient was seen sitting in a wheelchair in the clinic, in no acute distress  ASSESSMENT: The patient is doing satisfactorily with treatment.  PLAN: We will continue with the patient's radiation treatment as planned.

## 2012-11-02 NOTE — Progress Notes (Signed)
[name]  Here for PUT assessment.  No voiced concerns.  Upon inspection do not seen any evidence of shingles, even scabs, on his arms (origninal site), nor on his torso.  Educated staff that he no longer needs to wear a mask and that they do not need any PPE.

## 2012-11-02 NOTE — Progress Notes (Signed)
After Radiation Therapy checked Mr. [name]' arms and upper torso.  There was not any evidence of shingles or scabs from shingles.  Informed staff that he has been on Anti-viral medication(Famvir) since 10/26/12.  He does not have to wear a mask anymore and staff do not have to observe contact precautions any longer.

## 2012-11-02 NOTE — Progress Notes (Signed)
Department of Radiation Oncology  Phone:  [phone] Fax:        [phone]  Weekly Treatment Note    Name: [name] Date: 11/02/2012 MRN: [id number] DOB: [birth year]   Current dose: 14 Gy  Current fraction: 7   MEDICATIONS: Current Outpatient Prescriptions  Medication Sig Dispense Refill  . acetaminophen (TYLENOL) 325 MG tablet Take 650 mg by mouth every 4 (four) hours as needed.      . Artificial Tear Ointment (AKWA TEARS) 0.5 % ophthalmic ointment Place into the right eye as needed.      Marland Kitchen atorvastatin (LIPITOR) 20 MG tablet Take 20 mg by mouth daily.      . famciclovir (FAMVIR) 500 MG tablet Take 1 tablet (500 mg total) by mouth 3 (three) times daily.  21 tablet  0  . Fluticasone-Salmeterol (ADVAIR) 100-50 MCG/DOSE AEPB Inhale 1 puff into the lungs every 12 (twelve) hours.      Marland Kitchen HYDROcodone-acetaminophen (NORCO/VICODIN) 5-325 MG per tablet Take 1 tablet by mouth every 4 (four) hours as needed.      . Lacosamide (VIMPAT) 100 MG TABS Take 1 tablet by mouth 2 (two) times daily.      Marland Kitchen levETIRAcetam (KEPPRA) 1000 MG tablet Take 1,000 mg by mouth 2 (two) times daily.      Marland Kitchen lidocaine-prilocaine (EMLA) cream Apply topically as needed. Apply to port approx 1 hour before chemo appt, cover with tegaderm.  30 g  0  . mirtazapine (REMERON SOL-TAB) 15 MG disintegrating tablet Take 15 mg by mouth at bedtime.      . Multiple Vitamin (MULTIVITAMIN WITH MINERALS) TABS Take 1 tablet by mouth daily.      . nitroGLYCERIN (NITROSTAT) 0.4 MG SL tablet Place 0.4 mg under the tongue every 5 (five) minutes as needed.      . phenytoin (DILANTIN) 100 MG ER capsule Take 100 mg by mouth 2 (two) times daily. Take 2 capsules bid      . pregabalin (LYRICA) 150 MG capsule Take 150 mg by mouth daily.      . sennosides-docusate sodium (SENOKOT-S) 8.6-50 MG tablet Take 1 tablet by mouth as directed.      . Vardenafil HCl (LEVITRA PO) Take by mouth.      . vitamin C (ASCORBIC ACID) 500 MG tablet  Take 500 mg by mouth daily.      . prochlorperazine (COMPAZINE) 10 MG tablet Take 1 tablet (10 mg total) by mouth every 6 (six) hours as needed.  60 tablet  0     ALLERGIES: Review of patient's allergies indicates no known allergies.   LABORATORY DATA:  Lab Results  Component Value Date   WBC 3.6* 10/30/2012   HGB 14.8 10/30/2012   HCT 42.9 10/30/2012   MCV 92.3 10/30/2012   PLT 168 10/30/2012   Lab Results  Component Value Date   NA 141 10/03/2012   K 3.8 10/03/2012   CL 107 10/03/2012   CO2 27 10/03/2012   Lab Results  Component Value Date   ALT 18 10/03/2012   AST 17 10/03/2012   ALKPHOS 90 10/03/2012   BILITOT 0.36 10/03/2012     NARRATIVE: [name] was seen today for weekly treatment management. The chart was checked and the patient's films were reviewed. The patient is doing well he states. His shingles has markedly improved. No shortness of breath. No chest discomfort.  PHYSICAL EXAMINATION: temperature is 98.1 F (36.7 C). His blood pressure is 127/28 and his pulse is 81.  ASSESSMENT: The patient is doing satisfactorily with treatment.  PLAN: We will continue with the patient's radiation treatment as planned.

## 2012-11-03 ENCOUNTER — Ambulatory Visit
Admission: RE | Admit: 2012-11-03 | Discharge: 2012-11-03 | Disposition: A | Discharge status: Home / Self Care | Payer: Medicare Other | Source: Ambulatory Visit | Attending: Radiation Oncology | Admitting: Radiation Oncology

## 2012-11-06 ENCOUNTER — Ambulatory Visit (HOSPITAL_BASED_OUTPATIENT_CLINIC_OR_DEPARTMENT_OTHER): Payer: Medicare Other

## 2012-11-06 ENCOUNTER — Other Ambulatory Visit (HOSPITAL_BASED_OUTPATIENT_CLINIC_OR_DEPARTMENT_OTHER): Payer: Medicare Other | Admitting: Lab

## 2012-11-06 ENCOUNTER — Ambulatory Visit: Payer: Medicare Other

## 2012-11-06 ENCOUNTER — Ambulatory Visit
Admission: RE | Admit: 2012-11-06 | Discharge: 2012-11-06 | Disposition: A | Discharge status: Home / Self Care | Payer: Medicare Other | Source: Ambulatory Visit | Attending: Radiation Oncology | Admitting: Radiation Oncology

## 2012-11-06 VITALS — BP 105/61 | HR 71 | Temp 97.4°F

## 2012-11-06 DIAGNOSIS — C3490 Malignant neoplasm of unspecified part of unspecified bronchus or lung: Secondary | ICD-10-CM

## 2012-11-06 DIAGNOSIS — R569 Unspecified convulsions: Secondary | ICD-10-CM

## 2012-11-06 DIAGNOSIS — I2510 Atherosclerotic heart disease of native coronary artery without angina pectoris: Secondary | ICD-10-CM

## 2012-11-06 DIAGNOSIS — I639 Cerebral infarction, unspecified: Secondary | ICD-10-CM

## 2012-11-06 DIAGNOSIS — I10 Essential (primary) hypertension: Secondary | ICD-10-CM

## 2012-11-06 DIAGNOSIS — C3410 Malignant neoplasm of upper lobe, unspecified bronchus or lung: Secondary | ICD-10-CM

## 2012-11-06 DIAGNOSIS — Z5111 Encounter for antineoplastic chemotherapy: Secondary | ICD-10-CM

## 2012-11-06 LAB — CBC WITH DIFFERENTIAL/PLATELET
BASO%: 0.7 % (ref 0.0–2.0)
Basophils Absolute: 0 10*3/uL (ref 0.0–0.1)
EOS%: 6.3 % (ref 0.0–7.0)
HCT: 40.4 % (ref 38.4–49.9)
HGB: 13.7 g/dL (ref 13.0–17.1)
LYMPH%: 21.4 % (ref 14.0–49.0)
MCH: 32.2 pg (ref 27.2–33.4)
MCHC: 34 g/dL (ref 32.0–36.0)
MCV: 94.6 fL (ref 79.3–98.0)
MONO%: 13.5 % (ref 0.0–14.0)
NEUT%: 58.1 % (ref 39.0–75.0)
Platelets: 92 10*3/uL — ABNORMAL LOW (ref 140–400)
lymph#: 0.6 10*3/uL — ABNORMAL LOW (ref 0.9–3.3)

## 2012-11-06 LAB — COMPREHENSIVE METABOLIC PANEL (CC13)
ALT: 21 U/L (ref 0–55)
AST: 17 U/L (ref 5–34)
Alkaline Phosphatase: 91 U/L (ref 40–150)
BUN: 7.2 mg/dL (ref 7.0–26.0)
Calcium: 8.6 mg/dL (ref 8.4–10.4)
Creatinine: 0.8 mg/dL (ref 0.7–1.3)
Total Bilirubin: 0.38 mg/dL (ref 0.20–1.20)

## 2012-11-06 MED ORDER — FAMOTIDINE IN NACL 20-0.9 MG/50ML-% IV SOLN
20.0000 mg | Freq: Once | INTRAVENOUS | Status: AC
  Administered 2012-11-06: 20 mg via INTRAVENOUS

## 2012-11-06 MED ORDER — DEXAMETHASONE SODIUM PHOSPHATE 4 MG/ML IJ SOLN
20.0000 mg | Freq: Once | INTRAMUSCULAR | Status: AC
  Administered 2012-11-06: 20 mg via INTRAVENOUS

## 2012-11-06 MED ORDER — PACLITAXEL CHEMO INJECTION 300 MG/50ML
45.0000 mg/m2 | Freq: Once | INTRAVENOUS | Status: AC
  Administered 2012-11-06: 84 mg via INTRAVENOUS
  Filled 2012-11-06: qty 14

## 2012-11-06 MED ORDER — CARBOPLATIN CHEMO INJECTION 450 MG/45ML
212.4000 mg | Freq: Once | INTRAVENOUS | Status: AC
  Administered 2012-11-06: 210 mg via INTRAVENOUS
  Filled 2012-11-06: qty 21

## 2012-11-06 MED ORDER — HEPARIN SOD (PORK) LOCK FLUSH 100 UNIT/ML IV SOLN
500.0000 [IU] | Freq: Once | INTRAVENOUS | Status: AC | PRN
  Administered 2012-11-06: 500 [IU]
  Filled 2012-11-06: qty 5

## 2012-11-06 MED ORDER — SODIUM CHLORIDE 0.9 % IV SOLN
Freq: Once | INTRAVENOUS | Status: AC
  Administered 2012-11-06: 13:00:00 via INTRAVENOUS

## 2012-11-06 MED ORDER — ONDANSETRON 16 MG/50ML IVPB (CHCC)
16.0000 mg | Freq: Once | INTRAVENOUS | Status: AC
  Administered 2012-11-06: 16 mg via INTRAVENOUS

## 2012-11-06 MED ORDER — DIPHENHYDRAMINE HCL 50 MG/ML IJ SOLN
50.0000 mg | Freq: Once | INTRAMUSCULAR | Status: AC
  Administered 2012-11-06: 50 mg via INTRAVENOUS

## 2012-11-06 MED ORDER — SODIUM CHLORIDE 0.9 % IJ SOLN
10.0000 mL | INTRAMUSCULAR | Status: DC | PRN
  Administered 2012-11-06: 10 mL
  Filled 2012-11-06: qty 10

## 2012-11-06 NOTE — Patient Instructions (Signed)
Patient aware of next appointment; discharged with caregiver, per wheelchair; patient has no complaints.

## 2012-11-07 ENCOUNTER — Ambulatory Visit: Payer: Medicare Other

## 2012-11-08 ENCOUNTER — Ambulatory Visit: Payer: Medicare Other

## 2012-11-08 ENCOUNTER — Ambulatory Visit
Admission: RE | Admit: 2012-11-08 | Discharge: 2012-11-08 | Disposition: A | Discharge status: Home / Self Care | Payer: Medicare Other | Source: Ambulatory Visit | Attending: Radiation Oncology | Admitting: Radiation Oncology

## 2012-11-09 ENCOUNTER — Ambulatory Visit: Payer: Medicare Other

## 2012-11-10 ENCOUNTER — Ambulatory Visit: Payer: Medicare Other

## 2012-11-10 ENCOUNTER — Telehealth: Payer: Self-pay

## 2012-11-10 NOTE — Telephone Encounter (Signed)
Called [facility] and spoke to his nurse Marcelino Duster in regards to patient coming today.Transportation had not arrived  By 11:15 am.Treatment scheduled for 11:40 am.Informed if not here by 11:40 am , treatment to be cancelled today and resume on Monday as center closing at 3:00 pm.

## 2012-11-13 ENCOUNTER — Ambulatory Visit: Payer: Medicare Other

## 2012-11-13 ENCOUNTER — Ambulatory Visit
Admission: RE | Admit: 2012-11-13 | Discharge: 2012-11-13 | Disposition: A | Discharge status: Home / Self Care | Payer: Medicare Other | Source: Ambulatory Visit | Attending: Radiation Oncology | Admitting: Radiation Oncology

## 2012-11-14 ENCOUNTER — Ambulatory Visit
Admission: RE | Admit: 2012-11-14 | Discharge: 2012-11-14 | Disposition: A | Discharge status: Home / Self Care | Payer: Medicare Other | Source: Ambulatory Visit | Attending: Radiation Oncology | Admitting: Radiation Oncology

## 2012-11-14 ENCOUNTER — Ambulatory Visit: Payer: Medicare Other

## 2012-11-15 ENCOUNTER — Ambulatory Visit: Payer: Medicare Other

## 2012-11-15 ENCOUNTER — Ambulatory Visit
Admission: RE | Admit: 2012-11-15 | Discharge: 2012-11-15 | Disposition: A | Discharge status: Home / Self Care | Payer: Medicare Other | Source: Ambulatory Visit | Attending: Radiation Oncology | Admitting: Radiation Oncology

## 2012-11-16 ENCOUNTER — Ambulatory Visit: Payer: Medicare Other

## 2012-11-16 ENCOUNTER — Ambulatory Visit
Admission: RE | Admit: 2012-11-16 | Discharge: 2012-11-16 | Disposition: A | Discharge status: Home / Self Care | Payer: Medicare Other | Source: Ambulatory Visit | Attending: Radiation Oncology | Admitting: Radiation Oncology

## 2012-11-17 ENCOUNTER — Encounter: Payer: Self-pay | Admitting: Radiation Oncology

## 2012-11-17 ENCOUNTER — Ambulatory Visit
Admission: RE | Admit: 2012-11-17 | Discharge: 2012-11-17 | Disposition: A | Discharge status: Home / Self Care | Payer: Medicare Other | Source: Ambulatory Visit | Attending: Radiation Oncology | Admitting: Radiation Oncology

## 2012-11-17 ENCOUNTER — Ambulatory Visit: Payer: Medicare Other

## 2012-11-17 VITALS — BP 118/58 | HR 69 | Temp 98.6°F

## 2012-11-17 DIAGNOSIS — C3410 Malignant neoplasm of upper lobe, unspecified bronchus or lung: Secondary | ICD-10-CM

## 2012-11-17 DIAGNOSIS — C3490 Malignant neoplasm of unspecified part of unspecified bronchus or lung: Secondary | ICD-10-CM

## 2012-11-17 MED ORDER — RADIAPLEXRX EX GEL
Freq: Once | CUTANEOUS | Status: AC
  Administered 2012-11-17: 1 via TOPICAL

## 2012-11-17 NOTE — Progress Notes (Signed)
Patient here with caretaker from nursing home for weekly assessment of right chest radiation.Completed 15 of 30 treatments.Denies pain or any other symptoms related to treatment.Given radiaplex to apply twice daily to discolored skin in treatment field.Also informed caretaker to also apply posteriorly if needed but not prior to treatment in morning.

## 2012-11-17 NOTE — Progress Notes (Signed)
  Radiation Oncology         [phone] ________________________________  Name: Roderic Lammert MRN: [id number]  Date: 11/17/2012  DOB: 06-24-1946  Weekly Radiation Therapy Management  Current Dose: 30 Gy     Planned Dose:  60 Gy  Narrative . . . . . . . . The patient presents for routine under treatment assessment.                                               Patient here with caretaker from nursing home for weekly assessment of right chest radiation.Completed 15 of 30 treatments.Denies pain or any other symptoms related to treatment.Given radiaplex to apply twice daily to discolored skin in treatment field.Also informed caretaker to also apply posteriorly if needed but not prior to treatment in morning                                 Set-up films were reviewed.                                 The chart was checked. Physical Findings. . .  temperature is 98.6 F (37 C). His blood pressure is 118/58 and his pulse is 69. . Weight essentially stable.  No significant changes.  Modest hyperpigmentation on back.  No desq. Impression . . . . . . . The patient is tolerating radiation. Plan . . . . . . . . . . . . Continue treatment as planned.  ________________________________  Artist Pais. Kathrynn Running, M.D.

## 2012-11-17 NOTE — Addendum Note (Signed)
Encounter addended by: Eduardo Osier, RN on: 11/17/2012  2:07 PM<BR>     Documentation filed: Inpatient MAR

## 2012-11-20 ENCOUNTER — Ambulatory Visit
Admission: RE | Admit: 2012-11-20 | Discharge: 2012-11-20 | Disposition: A | Discharge status: Home / Self Care | Payer: Medicare Other | Source: Ambulatory Visit | Attending: Radiation Oncology | Admitting: Radiation Oncology

## 2012-11-21 ENCOUNTER — Ambulatory Visit
Admission: RE | Admit: 2012-11-21 | Discharge: 2012-11-21 | Disposition: A | Discharge status: Home / Self Care | Payer: Medicare Other | Source: Ambulatory Visit | Attending: Radiation Oncology | Admitting: Radiation Oncology

## 2012-11-22 ENCOUNTER — Ambulatory Visit
Admission: RE | Admit: 2012-11-22 | Discharge: 2012-11-22 | Disposition: A | Discharge status: Home / Self Care | Payer: Medicare Other | Source: Ambulatory Visit | Attending: Radiation Oncology | Admitting: Radiation Oncology

## 2012-11-23 ENCOUNTER — Ambulatory Visit
Admission: RE | Admit: 2012-11-23 | Discharge: 2012-11-23 | Disposition: A | Discharge status: Home / Self Care | Payer: Medicare Other | Source: Ambulatory Visit | Attending: Radiation Oncology | Admitting: Radiation Oncology

## 2012-11-24 ENCOUNTER — Ambulatory Visit
Admission: RE | Admit: 2012-11-24 | Discharge: 2012-11-24 | Disposition: A | Discharge status: Home / Self Care | Payer: Medicare Other | Source: Ambulatory Visit | Attending: Radiation Oncology | Admitting: Radiation Oncology

## 2012-11-24 MED ORDER — RADIAPLEXRX EX GEL
Freq: Once | CUTANEOUS | Status: AC
  Administered 2012-11-24: 13:00:00 via TOPICAL

## 2012-11-24 NOTE — Progress Notes (Signed)
Department of Radiation Oncology  Phone:  [phone] Fax:        [phone]  Weekly Treatment Note    Name: Jawaun Celmer Date: 11/24/2012 MRN: [id number] DOB: [birth year]   Current dose: 40 Gy  Current fraction: 20   MEDICATIONS: Current Outpatient Prescriptions  Medication Sig Dispense Refill  . acetaminophen (TYLENOL) 325 MG tablet Take 650 mg by mouth every 4 (four) hours as needed.      . Artificial Tear Ointment (AKWA TEARS) 0.5 % ophthalmic ointment Place into the right eye as needed.      Marland Kitchen atorvastatin (LIPITOR) 20 MG tablet Take 20 mg by mouth daily.      . famciclovir (FAMVIR) 500 MG tablet Take 1 tablet (500 mg total) by mouth 3 (three) times daily.  21 tablet  0  . Fluticasone-Salmeterol (ADVAIR) 100-50 MCG/DOSE AEPB Inhale 1 puff into the lungs every 12 (twelve) hours.      Marland Kitchen HYDROcodone-acetaminophen (NORCO/VICODIN) 5-325 MG per tablet Take 1 tablet by mouth every 4 (four) hours as needed.      . Lacosamide (VIMPAT) 100 MG TABS Take 1 tablet by mouth 2 (two) times daily.      Marland Kitchen levETIRAcetam (KEPPRA) 1000 MG tablet Take 1,000 mg by mouth 2 (two) times daily.      Marland Kitchen lidocaine-prilocaine (EMLA) cream Apply topically as needed. Apply to port approx 1 hour before chemo appt, cover with tegaderm.  30 g  0  . mirtazapine (REMERON SOL-TAB) 15 MG disintegrating tablet Take 15 mg by mouth at bedtime.      . Multiple Vitamin (MULTIVITAMIN WITH MINERALS) TABS Take 1 tablet by mouth daily.      . nitroGLYCERIN (NITROSTAT) 0.4 MG SL tablet Place 0.4 mg under the tongue every 5 (five) minutes as needed.      . phenytoin (DILANTIN) 100 MG ER capsule Take 100 mg by mouth 2 (two) times daily. Take 2 capsules bid      . pregabalin (LYRICA) 150 MG capsule Take 150 mg by mouth daily.      . sennosides-docusate sodium (SENOKOT-S) 8.6-50 MG tablet Take 1 tablet by mouth as directed.      . Vardenafil HCl (LEVITRA PO) Take by mouth.      . vitamin C (ASCORBIC ACID) 500 MG tablet  Take 500 mg by mouth daily.      . prochlorperazine (COMPAZINE) 10 MG tablet Take 1 tablet (10 mg total) by mouth every 6 (six) hours as needed.  60 tablet  0   No current facility-administered medications for this encounter.     ALLERGIES: Review of patient's allergies indicates no known allergies.   LABORATORY DATA:  Lab Results  Component Value Date   WBC 2.8* 11/06/2012   HGB 13.7 11/06/2012   HCT 40.4 11/06/2012   MCV 94.6 11/06/2012   PLT 92* 11/06/2012   Lab Results  Component Value Date   NA 141 11/06/2012   K 3.9 11/06/2012   CL 108* 11/06/2012   CO2 26 11/06/2012   Lab Results  Component Value Date   ALT 21 11/06/2012   AST 17 11/06/2012   ALKPHOS 91 11/06/2012   BILITOT 0.38 11/06/2012     NARRATIVE: Sherril Osberg was seen today for weekly treatment management. The chart was checked and the patient's films were reviewed. The patient states that he is doing well with treatment. He denies any cough, discomfort or pain, or shortness of breath. He has been given radio plaques cream to use  for the treatment field area.  PHYSICAL EXAMINATION: temperature is 97.9 F (36.6 C). His blood pressure is 107/67 and his pulse is 73. His oxygen saturation is 99%.        ASSESSMENT: The patient is doing satisfactorily with treatment.  PLAN: We will continue with the patient's radiation treatment as planned.

## 2012-11-24 NOTE — Progress Notes (Signed)
Patient and attendant  here for weekly assessment of right lung cancer.Completed 20 of 30 treatments.Denies pain, shortness of breath or cough.No fatigue.Given additional tube of radiaplex to apply dry flaky skin in treatment field.

## 2012-11-27 ENCOUNTER — Ambulatory Visit
Admission: RE | Admit: 2012-11-27 | Discharge: 2012-11-27 | Disposition: A | Discharge status: Home / Self Care | Payer: Medicare Other | Source: Ambulatory Visit | Attending: Radiation Oncology | Admitting: Radiation Oncology

## 2012-11-28 ENCOUNTER — Ambulatory Visit
Admission: RE | Admit: 2012-11-28 | Discharge: 2012-11-28 | Disposition: A | Discharge status: Home / Self Care | Payer: Medicare Other | Source: Ambulatory Visit | Attending: Radiation Oncology | Admitting: Radiation Oncology

## 2012-11-28 ENCOUNTER — Encounter: Payer: Self-pay | Admitting: *Deleted

## 2012-11-29 ENCOUNTER — Ambulatory Visit
Admission: RE | Admit: 2012-11-29 | Discharge: 2012-11-29 | Disposition: A | Discharge status: Home / Self Care | Payer: Medicare Other | Source: Ambulatory Visit | Attending: Radiation Oncology | Admitting: Radiation Oncology

## 2012-11-30 ENCOUNTER — Ambulatory Visit
Admission: RE | Admit: 2012-11-30 | Discharge: 2012-11-30 | Disposition: A | Discharge status: Home / Self Care | Payer: Medicare Other | Source: Ambulatory Visit | Attending: Radiation Oncology | Admitting: Radiation Oncology

## 2012-12-01 ENCOUNTER — Ambulatory Visit: Payer: Medicare Other

## 2012-12-04 ENCOUNTER — Ambulatory Visit
Admission: RE | Admit: 2012-12-04 | Discharge: 2012-12-04 | Disposition: A | Discharge status: Home / Self Care | Payer: Medicare Other | Source: Ambulatory Visit | Attending: Radiation Oncology | Admitting: Radiation Oncology

## 2012-12-04 ENCOUNTER — Encounter: Payer: Self-pay | Admitting: Radiation Oncology

## 2012-12-04 ENCOUNTER — Ambulatory Visit: Payer: Medicare Other

## 2012-12-04 NOTE — Addendum Note (Signed)
Encounter addended by: Glennie Hawk, RN on: 12/04/2012  3:07 PM<BR>     Documentation filed: Inpatient Document Flowsheet

## 2012-12-04 NOTE — Progress Notes (Signed)
Department of Radiation Oncology  Phone:  [phone] Fax:        [phone]  Weekly Treatment Note    Name: [name] Date: 12/04/2012 MRN: [id number] DOB: 05/25/1946   Current dose: 50 Gy  Current fraction: 25   MEDICATIONS: Current Outpatient Prescriptions  Medication Sig Dispense Refill  . acetaminophen (TYLENOL) 325 MG tablet Take 650 mg by mouth every 4 (four) hours as needed.      . Artificial Tear Ointment (AKWA TEARS) 0.5 % ophthalmic ointment Place into the right eye as needed.      Marland Kitchen atorvastatin (LIPITOR) 20 MG tablet Take 20 mg by mouth daily.      . famciclovir (FAMVIR) 500 MG tablet Take 1 tablet (500 mg total) by mouth 3 (three) times daily.  21 tablet  0  . Fluticasone-Salmeterol (ADVAIR) 100-50 MCG/DOSE AEPB Inhale 1 puff into the lungs every 12 (twelve) hours.      Marland Kitchen HYDROcodone-acetaminophen (NORCO/VICODIN) 5-325 MG per tablet Take 1 tablet by mouth every 4 (four) hours as needed.      . Lacosamide (VIMPAT) 100 MG TABS Take 1 tablet by mouth 2 (two) times daily.      Marland Kitchen levETIRAcetam (KEPPRA) 1000 MG tablet Take 1,000 mg by mouth 2 (two) times daily.      Marland Kitchen lidocaine-prilocaine (EMLA) cream Apply topically as needed. Apply to port approx 1 hour before chemo appt, cover with tegaderm.  30 g  0  . mirtazapine (REMERON SOL-TAB) 15 MG disintegrating tablet Take 15 mg by mouth at bedtime.      . Multiple Vitamin (MULTIVITAMIN WITH MINERALS) TABS Take 1 tablet by mouth daily.      . nitroGLYCERIN (NITROSTAT) 0.4 MG SL tablet Place 0.4 mg under the tongue every 5 (five) minutes as needed.      . phenytoin (DILANTIN) 100 MG ER capsule Take 100 mg by mouth 2 (two) times daily. Take 2 capsules bid      . pregabalin (LYRICA) 150 MG capsule Take 150 mg by mouth daily.      . sennosides-docusate sodium (SENOKOT-S) 8.6-50 MG tablet Take 1 tablet by mouth as directed.      . Vardenafil HCl (LEVITRA PO) Take by mouth.      . vitamin C (ASCORBIC ACID) 500 MG tablet  Take 500 mg by mouth daily.      . prochlorperazine (COMPAZINE) 10 MG tablet Take 1 tablet (10 mg total) by mouth every 6 (six) hours as needed.  60 tablet  0   No current facility-administered medications for this encounter.     ALLERGIES: Review of patient's allergies indicates no known allergies.   LABORATORY DATA:  Lab Results  Component Value Date   WBC 2.8* 11/06/2012   HGB 13.7 11/06/2012   HCT 40.4 11/06/2012   MCV 94.6 11/06/2012   PLT 92* 11/06/2012   Lab Results  Component Value Date   NA 141 11/06/2012   K 3.9 11/06/2012   CL 108* 11/06/2012   CO2 26 11/06/2012   Lab Results  Component Value Date   ALT 21 11/06/2012   AST 17 11/06/2012   ALKPHOS 91 11/06/2012   BILITOT 0.38 11/06/2012     NARRATIVE: [name] was seen today for weekly treatment management. The chart was checked and the patient's films were reviewed. The patient states that he is doing well. He denies any shortness of breath or esophagitis. No complaints otherwise  PHYSICAL EXAMINATION: oral temperature is 97.7 F (36.5 C). His  blood pressure is 108/57 and his pulse is 78. His respiration is 20 and oxygen saturation is 95%.        ASSESSMENT: The patient is doing satisfactorily with treatment.  PLAN: We will continue with the patient's radiation treatment as planned.

## 2012-12-04 NOTE — Progress Notes (Signed)
Pt denies pain, cough, SOB, fatigue, loss of appetite. He continues to reside at [facility] nursing home. Caretaker w/pt today.

## 2012-12-05 ENCOUNTER — Ambulatory Visit
Admission: RE | Admit: 2012-12-05 | Discharge: 2012-12-05 | Disposition: A | Discharge status: Home / Self Care | Payer: Medicare Other | Source: Ambulatory Visit | Attending: Radiation Oncology | Admitting: Radiation Oncology

## 2012-12-06 ENCOUNTER — Ambulatory Visit
Admission: RE | Admit: 2012-12-06 | Discharge: 2012-12-06 | Disposition: A | Discharge status: Home / Self Care | Payer: Medicare Other | Source: Ambulatory Visit | Attending: Radiation Oncology | Admitting: Radiation Oncology

## 2012-12-07 ENCOUNTER — Other Ambulatory Visit: Payer: Self-pay | Admitting: *Deleted

## 2012-12-07 ENCOUNTER — Ambulatory Visit
Admission: RE | Admit: 2012-12-07 | Discharge: 2012-12-07 | Disposition: A | Discharge status: Home / Self Care | Payer: Medicare Other | Source: Ambulatory Visit | Attending: Radiation Oncology | Admitting: Radiation Oncology

## 2012-12-07 NOTE — Progress Notes (Signed)
Per dr Donnald Garre, pt needs CT scan and f/u appt.  Onc tx schedule filled out.  SLJ

## 2012-12-08 ENCOUNTER — Encounter: Payer: Self-pay | Admitting: Radiation Oncology

## 2012-12-08 ENCOUNTER — Ambulatory Visit
Admission: RE | Admit: 2012-12-08 | Discharge: 2012-12-08 | Disposition: A | Discharge status: Home / Self Care | Payer: Medicare Other | Source: Ambulatory Visit | Attending: Radiation Oncology | Admitting: Radiation Oncology

## 2012-12-08 ENCOUNTER — Ambulatory Visit: Payer: Medicare Other

## 2012-12-08 NOTE — Progress Notes (Signed)
[name] is here for his weekly ut visit.  He has had 29/30 fractions to his right lung.  He denies pain and shortness of breath at this time.  His skin on his right chest is intact with some hyperpigmentation.  He is complaining of itching on his right chest.

## 2012-12-08 NOTE — Progress Notes (Signed)
Department of Radiation Oncology  Phone:  [phone] Fax:        [phone]  Weekly Treatment Note    Name: [name] Date: 12/08/2012 MRN: [id number] DOB: [birth year]   Current dose: 58 Gy  Current fraction: 29   MEDICATIONS: Current Outpatient Prescriptions  Medication Sig Dispense Refill  . acetaminophen (TYLENOL) 325 MG tablet Take 650 mg by mouth every 4 (four) hours as needed.      . Artificial Tear Ointment (AKWA TEARS) 0.5 % ophthalmic ointment Place into the right eye as needed.      Marland Kitchen atorvastatin (LIPITOR) 20 MG tablet Take 20 mg by mouth daily.      . famciclovir (FAMVIR) 500 MG tablet Take 1 tablet (500 mg total) by mouth 3 (three) times daily.  21 tablet  0  . Fluticasone-Salmeterol (ADVAIR) 100-50 MCG/DOSE AEPB Inhale 1 puff into the lungs every 12 (twelve) hours.      Marland Kitchen HYDROcodone-acetaminophen (NORCO/VICODIN) 5-325 MG per tablet Take 1 tablet by mouth every 4 (four) hours as needed.      . Lacosamide (VIMPAT) 100 MG TABS Take 1 tablet by mouth 2 (two) times daily.      Marland Kitchen levETIRAcetam (KEPPRA) 1000 MG tablet Take 1,000 mg by mouth 2 (two) times daily.      Marland Kitchen lidocaine-prilocaine (EMLA) cream Apply topically as needed. Apply to port approx 1 hour before chemo appt, cover with tegaderm.  30 g  0  . mirtazapine (REMERON SOL-TAB) 15 MG disintegrating tablet Take 15 mg by mouth at bedtime.      . Multiple Vitamin (MULTIVITAMIN WITH MINERALS) TABS Take 1 tablet by mouth daily.      . nitroGLYCERIN (NITROSTAT) 0.4 MG SL tablet Place 0.4 mg under the tongue every 5 (five) minutes as needed.      . phenytoin (DILANTIN) 100 MG ER capsule Take 100 mg by mouth 2 (two) times daily. Take 2 capsules bid      . pregabalin (LYRICA) 150 MG capsule Take 150 mg by mouth daily.      . sennosides-docusate sodium (SENOKOT-S) 8.6-50 MG tablet Take 1 tablet by mouth as directed.      . Vardenafil HCl (LEVITRA PO) Take by mouth.      . vitamin C (ASCORBIC ACID) 500 MG tablet  Take 500 mg by mouth daily.      . prochlorperazine (COMPAZINE) 10 MG tablet Take 1 tablet (10 mg total) by mouth every 6 (six) hours as needed.  60 tablet  0   No current facility-administered medications for this encounter.     ALLERGIES: Review of patient's allergies indicates no known allergies.   LABORATORY DATA:  Lab Results  Component Value Date   WBC 2.8* 11/06/2012   HGB 13.7 11/06/2012   HCT 40.4 11/06/2012   MCV 94.6 11/06/2012   PLT 92* 11/06/2012   Lab Results  Component Value Date   NA 141 11/06/2012   K 3.9 11/06/2012   CL 108* 11/06/2012   CO2 26 11/06/2012   Lab Results  Component Value Date   ALT 21 11/06/2012   AST 17 11/06/2012   ALKPHOS 91 11/06/2012   BILITOT 0.38 11/06/2012     NARRATIVE: [name] was seen today for weekly treatment management. The chart was checked and the patient's films were reviewed. The patient continues to do very well. He denies changes in shortness of breath. He denies any chest discomfort/esophagitis. He is in good spirits today and is accompanied by a  caregiver.  PHYSICAL EXAMINATION: temperature is 98.3 F (36.8 C). His blood pressure is 104/64 and his pulse is 77. His oxygen saturation is 100%.        ASSESSMENT: The patient is doing satisfactorily with treatment.  PLAN: We will continue with the patient's radiation treatment as planned.

## 2012-12-11 ENCOUNTER — Telehealth: Payer: Self-pay | Admitting: Internal Medicine

## 2012-12-11 ENCOUNTER — Ambulatory Visit
Admission: RE | Admit: 2012-12-11 | Discharge: 2012-12-11 | Disposition: A | Discharge status: Home / Self Care | Payer: Medicare Other | Source: Ambulatory Visit | Attending: Radiation Oncology | Admitting: Radiation Oncology

## 2012-12-11 ENCOUNTER — Other Ambulatory Visit (HOSPITAL_COMMUNITY): Payer: Medicare Other

## 2012-12-12 ENCOUNTER — Ambulatory Visit
Admission: RE | Admit: 2012-12-12 | Discharge: 2012-12-12 | Disposition: A | Discharge status: Home / Self Care | Payer: Medicare Other | Source: Ambulatory Visit | Attending: Radiation Oncology | Admitting: Radiation Oncology

## 2012-12-13 ENCOUNTER — Ambulatory Visit (HOSPITAL_COMMUNITY)
Admission: RE | Admit: 2012-12-13 | Discharge: 2012-12-13 | Disposition: A | Discharge status: Home / Self Care | Payer: Medicare Other | Source: Ambulatory Visit | Attending: Physician Assistant | Admitting: Physician Assistant

## 2012-12-13 ENCOUNTER — Ambulatory Visit
Admission: RE | Admit: 2012-12-13 | Discharge: 2012-12-13 | Disposition: A | Discharge status: Home / Self Care | Payer: Medicare Other | Source: Ambulatory Visit | Attending: Radiation Oncology | Admitting: Radiation Oncology

## 2012-12-13 ENCOUNTER — Other Ambulatory Visit (HOSPITAL_BASED_OUTPATIENT_CLINIC_OR_DEPARTMENT_OTHER): Payer: Medicare Other | Admitting: Lab

## 2012-12-13 DIAGNOSIS — I10 Essential (primary) hypertension: Secondary | ICD-10-CM

## 2012-12-13 DIAGNOSIS — I2510 Atherosclerotic heart disease of native coronary artery without angina pectoris: Secondary | ICD-10-CM

## 2012-12-13 DIAGNOSIS — C3490 Malignant neoplasm of unspecified part of unspecified bronchus or lung: Secondary | ICD-10-CM | POA: Insufficient documentation

## 2012-12-13 DIAGNOSIS — I639 Cerebral infarction, unspecified: Secondary | ICD-10-CM

## 2012-12-13 DIAGNOSIS — I6350 Cerebral infarction due to unspecified occlusion or stenosis of unspecified cerebral artery: Secondary | ICD-10-CM

## 2012-12-13 DIAGNOSIS — R569 Unspecified convulsions: Secondary | ICD-10-CM

## 2012-12-13 DIAGNOSIS — Z951 Presence of aortocoronary bypass graft: Secondary | ICD-10-CM | POA: Insufficient documentation

## 2012-12-13 DIAGNOSIS — R911 Solitary pulmonary nodule: Secondary | ICD-10-CM | POA: Insufficient documentation

## 2012-12-13 LAB — COMPREHENSIVE METABOLIC PANEL (CC13)
AST: 19 U/L (ref 5–34)
Albumin: 3.1 g/dL — ABNORMAL LOW (ref 3.5–5.0)
BUN: 6.9 mg/dL — ABNORMAL LOW (ref 7.0–26.0)
CO2: 25 mEq/L (ref 22–29)
Calcium: 8.9 mg/dL (ref 8.4–10.4)
Chloride: 108 mEq/L — ABNORMAL HIGH (ref 98–107)
Creatinine: 0.8 mg/dL (ref 0.7–1.3)
Glucose: 103 mg/dl — ABNORMAL HIGH (ref 70–99)
Potassium: 3.9 mEq/L (ref 3.5–5.1)

## 2012-12-13 LAB — CBC WITH DIFFERENTIAL/PLATELET
Basophils Absolute: 0 10*3/uL (ref 0.0–0.1)
EOS%: 16.9 % — ABNORMAL HIGH (ref 0.0–7.0)
Eosinophils Absolute: 0.5 10*3/uL (ref 0.0–0.5)
HCT: 42.7 % (ref 38.4–49.9)
HGB: 14.4 g/dL (ref 13.0–17.1)
MCH: 33.4 pg (ref 27.2–33.4)
MONO#: 0.3 10*3/uL (ref 0.1–0.9)
NEUT#: 1.8 10*3/uL (ref 1.5–6.5)
NEUT%: 62.9 % (ref 39.0–75.0)
RDW: 17.1 % — ABNORMAL HIGH (ref 11.0–14.6)
lymph#: 0.3 10*3/uL — ABNORMAL LOW (ref 0.9–3.3)

## 2012-12-13 MED ORDER — IOHEXOL 300 MG/ML  SOLN
80.0000 mL | Freq: Once | INTRAMUSCULAR | Status: AC | PRN
  Administered 2012-12-13: 80 mL via INTRAVENOUS

## 2012-12-14 ENCOUNTER — Ambulatory Visit
Admission: RE | Admit: 2012-12-14 | Discharge: 2012-12-14 | Disposition: A | Discharge status: Home / Self Care | Payer: Medicare Other | Source: Ambulatory Visit | Attending: Radiation Oncology | Admitting: Radiation Oncology

## 2012-12-14 ENCOUNTER — Ambulatory Visit: Payer: Medicare Other

## 2012-12-14 ENCOUNTER — Encounter: Payer: Self-pay | Admitting: Radiation Oncology

## 2012-12-14 ENCOUNTER — Encounter: Payer: Self-pay | Admitting: Internal Medicine

## 2012-12-14 ENCOUNTER — Telehealth: Payer: Self-pay | Admitting: Internal Medicine

## 2012-12-14 ENCOUNTER — Ambulatory Visit (HOSPITAL_BASED_OUTPATIENT_CLINIC_OR_DEPARTMENT_OTHER): Payer: Medicare Other | Admitting: Internal Medicine

## 2012-12-14 DIAGNOSIS — C3410 Malignant neoplasm of upper lobe, unspecified bronchus or lung: Secondary | ICD-10-CM

## 2012-12-14 MED ORDER — RADIAPLEXRX EX GEL
Freq: Once | CUTANEOUS | Status: AC
  Administered 2012-12-14: 15:00:00 via TOPICAL

## 2012-12-14 NOTE — Progress Notes (Signed)
Weekly Management Note Current Dose:66 Gy  Projected Dose:66 Gy   Narrative:  The patient presents for routine under treatment assessment.  CBCT/MVCT images/Port film x-rays were reviewed.  The chart was checked. No worsening breathing symptoms or shortness of breath. No esophagitis. Has rash on bilateral hands for unknown period of time. Itching and raw.   Physical Findings:  Macular dry rash on bilateral wrists with red spots up to forearm.   Vitals:  Filed Vitals:   12/14/12 1451  BP: 109/67  Pulse: 73  Temp: 97.6 F (36.4 C)   Weight:  Wt Readings from Last 3 Encounters:  12/14/12 145 lb (65.772 kg)  10/03/12 158 lb 14.4 oz (72.077 kg)  10/02/12 161 lb (73.029 kg)   Lab Results  Component Value Date   WBC 2.9* 12/13/2012   HGB 14.4 12/13/2012   HCT 42.7 12/13/2012   MCV 99.1* 12/13/2012   PLT 126* 12/13/2012   Lab Results  Component Value Date   CREATININE 0.8 12/13/2012   BUN 6.9* 12/13/2012   NA 142 12/13/2012   K 3.9 12/13/2012   CL 108* 12/13/2012   CO2 25 12/13/2012     Impression:  The patient finishes RT today.   Plan:  Continue treatment as planned. Hydrocortisone to rash and follow up with facility MD. Follow up in 1 month. Scan ordered by [facility] in May.

## 2012-12-14 NOTE — Progress Notes (Signed)
[facility] [phone]   Fax:([phone]  OFFICE PROGRESS NOTE  JOHNSON, ADRENA E, PA-C [address] [facility] [place] [address]  (T1 B. DIAGNOSIS: stage IIIA (T1a, N2, M0) non-small cell lung cancer, adenocarcinoma diagnosed in December of 2013.  PRIOR THERAPY: None  CURRENT THERAPY: concurrent chemoradiation with weekly carboplatin for AUC of 2 and paclitaxel 45 mg/M2, status post 4 weekly doses of chemotherapy last dose was given on 11/06/2012 and last fraction of radiotherapy tomorrow.  INTERVAL HISTORY: [name] 67 y.o. male returns to the clinic today for followup visit accompanied by his caregiver from the skilled nursing facility. The patient is feeling fine today with no specific complaints. He is tolerating his concurrent chemoradiation fairly well with no significant adverse effects. He denied having any significant nausea or vomiting, no fever or chills. He denied having any chest pain, shortness of breath, cough or hemoptysis.has no significant weight loss or night sweats.  MEDICAL HISTORY: Past Medical History  Diagnosis Date  . Prostate cancer 04/22/09    Adenomacarcinoma,glleason=#=#=6,volume=32.9cc,PSA=5.91  . Gout     hx  . Myocardial infarction   . Anxiety   . Depression   . PVD (peripheral vascular disease)   . Stroke     hx syndrome  . Seizures   . Hypertension   . Hypercholesterolemia   . History of radiation therapy 08/25/2009-10/28/2009    prostate 78 Gy/40 fractions  . Lung cancer 08/29/12    bx= right  upper outer lobe lung=Adenocarcinoma  . Anemia   . CAD (coronary artery disease)   . Gangrene     hx  . Hemiparesis     left , and contracture lue  . Sinus tachycardia     hx  . Seizure 10/03/2012  . CAD (coronary artery disease) 10/03/2012  . HTN (hypertension) 10/03/2012  . Stroke 10/03/2012    ALLERGIES:  has No Known Allergies.  MEDICATIONS:  Current Outpatient Prescriptions    Medication Sig Dispense Refill  . acetaminophen (TYLENOL) 325 MG tablet Take 650 mg by mouth every 4 (four) hours as needed.      . Artificial Tear Ointment (AKWA TEARS) 0.5 % ophthalmic ointment Place into the right eye as needed.      Marland Kitchen atorvastatin (LIPITOR) 20 MG tablet Take 20 mg by mouth daily.      . famciclovir (FAMVIR) 500 MG tablet Take 1 tablet (500 mg total) by mouth 3 (three) times daily.  21 tablet  0  . Fluticasone-Salmeterol (ADVAIR) 100-50 MCG/DOSE AEPB Inhale 1 puff into the lungs every 12 (twelve) hours.      Marland Kitchen HYDROcodone-acetaminophen (NORCO/VICODIN) 5-325 MG per tablet Take 1 tablet by mouth every 4 (four) hours as needed.      . Lacosamide (VIMPAT) 100 MG TABS Take 1 tablet by mouth 2 (two) times daily.      Marland Kitchen levETIRAcetam (KEPPRA) 1000 MG tablet Take 1,000 mg by mouth 2 (two) times daily.      Marland Kitchen lidocaine-prilocaine (EMLA) cream Apply topically as needed. Apply to port approx 1 hour before chemo appt, cover with tegaderm.  30 g  0  . mirtazapine (REMERON SOL-TAB) 15 MG disintegrating tablet Take 15 mg by mouth at bedtime.      . Multiple Vitamin (MULTIVITAMIN WITH MINERALS) TABS Take 1 tablet by mouth daily.      . nitroGLYCERIN (NITROSTAT) 0.4 MG SL tablet Place 0.4 mg under the tongue every 5 (five) minutes  as needed.      . phenytoin (DILANTIN) 100 MG ER capsule Take 100 mg by mouth 2 (two) times daily. Take 2 capsules bid      . pregabalin (LYRICA) 150 MG capsule Take 150 mg by mouth daily.      . prochlorperazine (COMPAZINE) 10 MG tablet Take 1 tablet (10 mg total) by mouth every 6 (six) hours as needed.  60 tablet  0  . sennosides-docusate sodium (SENOKOT-S) 8.6-50 MG tablet Take 1 tablet by mouth as directed.      . Vardenafil HCl (LEVITRA PO) Take by mouth.      . vitamin C (ASCORBIC ACID) 500 MG tablet Take 500 mg by mouth daily.       No current facility-administered medications for this visit.    SURGICAL HISTORY:  Past Surgical History  Procedure  Laterality Date  . 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 Lung biopsy  08/29/12    RUO lung=Adenocarcinoma  . Prostate biopsy  04/22/09    Adenocarcinoma    REVIEW OF SYSTEMS:  A comprehensive review of systems was negative except for: Constitutional: positive for fatigue   PHYSICAL EXAMINATION: General appearance: alert, cooperative and no distress Head: Normocephalic, without obvious abnormality, atraumatic Neck: no adenopathy Resp: clear to auscultation bilaterally Cardio: regular rate and rhythm, S1, S2 normal, no murmur, click, rub or gallop GI: soft, non-tender; bowel sounds normal; no masses,  no organomegaly Extremities: extremities normal, atraumatic, no cyanosis or edema  ECOG PERFORMANCE STATUS: 2 - Symptomatic, <50% confined to bed  Blood pressure 102/59, pulse 79, temperature 98.6 F (37 C), temperature source Oral, resp. rate 18, height 5\' 8"  (1.727 m), weight 145 lb (65.772 kg).  LABORATORY DATA: Lab Results  Component Value Date   WBC 2.9* 12/13/2012   HGB 14.4 12/13/2012   HCT 42.7 12/13/2012   MCV 99.1* 12/13/2012   PLT 126* 12/13/2012      Chemistry      Component Value Date/Time   NA 142 12/13/2012 1023   NA 140 03/10/2009 1220   K 3.9 12/13/2012 1023   K 4.2 03/10/2009 1220   CL 108* 12/13/2012 1023   CL 109 03/10/2009 1220   CO2 25 12/13/2012 1023   CO2 26 03/10/2009 1220   BUN 6.9* 12/13/2012 1023   BUN 13 03/10/2009 1220   CREATININE 0.8 12/13/2012 1023   CREATININE 0.88 03/10/2009 1220      Component Value Date/Time   CALCIUM 8.9 12/13/2012 1023   CALCIUM 8.8 03/10/2009 1220   ALKPHOS 129 12/13/2012 1023   ALKPHOS 57 10/25/2007 0545   AST 19 12/13/2012 1023   AST 97* 10/25/2007 0545   ALT 22 12/13/2012 1023   ALT 32 10/25/2007 0545   BILITOT 0.40 12/13/2012 1023   BILITOT 0.8 10/25/2007 0545       RADIOGRAPHIC STUDIES: Ct Chest W Contrast  12/13/2012  *RADIOLOGY REPORT*  Clinical Data: Lung cancer restaging.  CT CHEST  WITH CONTRAST  Technique:  Multidetector CT imaging of the chest was performed following the standard protocol during bolus administration of intravenous contrast.  Contrast: 80mL OMNIPAQUE IOHEXOL 300 MG/ML  SOLN  Comparison: None.  Findings: The tip of the right-sided Port-A-Cath is positioned at the junction of the SVC and RA.  There is no axillary lymphadenopathy.  1.6 cm necrotic short-axis right paratracheal lymph node is noted.  1.6 cm short-axis precarinal lymph node also shows central necrosis.  No  hilar lymphadenopathy.  Heart size is normal.  The patient is status post CABG.  No pericardial or pleural effusion.  Lung windows demonstrate to a 1.8 x 1.8 cm spiculated right upper lobe nodule with some postobstructive airspace disease.  Lung volumes are low bilaterally with probable compressive atelectasis in the dependent upper and lower lobes.  No parenchymal nodule or mass in the left lung.  Bone windows reveal no worrisome lytic or sclerotic osseous lesions.  IMPRESSION: 1.8 cm spiculated right upper lobe nodule consistent with neoplasm. There is necrotic lymphadenopathy in the right mediastinum.  No evidence for bulky lymphadenopathy in the right hilum.   Original Report Authenticated By: Kennith Center, M.D.     ASSESSMENT: this is a very pleasant 67 years old [place] American male with history of stage IIIa non-small cell lung cancer currently undergoing concurrent chemoradiation with carboplatin and paclitaxel. He is expected to finish this course tomorrow. The patient is tolerating his treatment fairly well  PLAN: I have a lengthy discussion with the patient today about his condition. I recommended for him to come back for followup visit in 6 weeks for reevaluation with repeat staging scan of the chest. He was advised to call immediately if he has any concerning symptoms in the interval.  All questions were answered. The patient knows to call the clinic with any problems, questions or concerns.  We can certainly see the patient much sooner if necessary.

## 2012-12-14 NOTE — Progress Notes (Signed)
Patient here completion of radiation of right lung.Completed 30 of 30 treatments with 3 boost.Denies pain, shortness of breath or coughing up anything.Ct of chest done on 12/13/12.Dr.Mohamed ordered ct of chest to be performed in May 2014.Continue application of radiaplex to right anterior and posterior chest.Will have one month follow up scheduled.

## 2012-12-14 NOTE — Addendum Note (Signed)
Encounter addended by: Tessa Lerner, RN on: 12/14/2012  4:56 PM<BR>     Documentation filed: Inpatient MAR

## 2012-12-15 ENCOUNTER — Ambulatory Visit: Payer: Medicare Other

## 2012-12-15 NOTE — Patient Instructions (Signed)
Follow up visit in 6 weeks with repeat CT scan of the

## 2012-12-21 NOTE — Progress Notes (Signed)
  Radiation Oncology         [phone]) [phone] ________________________________  Name: [name] MRN: [id number]  Date: 12/08/2012  DOB: 02/24/1946  COMPLEX SIMULATION  NOTE  Diagnosis: lung cancer  Narrative The patient has undergone a complex simulation for the patient's upcoming boost treatment.   Radiation dose prior to boost: 60 Gy  Boost dose to the high risk target:  6 Gy, to be delivered in 3 fractions  To accomplish the boost treatment, an additional 3 customized blocks have been designed for this purpose, and each of these complex treatment devices will be used on a daily basis. A complex isodose plan is requested to ensure that the high-risk target region receives the appropriate radiation dose and that the nearby normal structures continue to be appropriately spared. Dose volume histograms for the cumulative plan will be reviewed for the relevant structures.   Total dose after boost:  66 Gy    ________________________________   Radene Gunning, MD, PhD

## 2012-12-21 NOTE — Progress Notes (Signed)
  Radiation Oncology         ([phone]) [phone] ________________________________  Name: Floy Riegler MRN: [id number]  Date: 12/14/2012  DOB: 1946-08-21  End of Treatment Note  Diagnosis:   Lung cancer     Indication for treatment:  Curative       Radiation treatment dates:   10/25/2012 through 12/14/2012  Site/dose:   The patient was treated to the gross disease within the right lung/mediastinum. He was treated using a 3-D conformal technique to a dose of 66 gray in 33 fractions. The patient initially was planned using a 5 field technique. He was noted prior to the beginning of his treatment that the patient's anatomy had change. He therefore required re\re simulation and a second additional 5 field technique was planned again. This was the plan which was used for the initial 60 gray. The patient then received a cone down boost treatment for the final 6 gray using a three-field technique.    Narrative: The patient tolerated radiation treatment relatively well.   He did not have major difficulties with esophagitis during treatment. He denied any worsening shortness of breath.  Plan: The patient has completed radiation treatment. The patient will return to radiation oncology clinic for routine followup in one month. I advised the patient to call or return sooner if they have any questions or concerns related to their recovery or treatment. ________________________________  Radene Gunning, M.D., Ph.D.

## 2012-12-21 NOTE — Progress Notes (Signed)
  Radiation Oncology         [phone]) [phone] ________________________________  Name: Ignatius Kloos MRN: [id number]  Date: 10/18/2012  DOB: 02-27-1946  SIMULATION AND TREATMENT PLANNING NOTE  DIAGNOSIS:  Lung cancer  NARRATIVE:  The patient was brought for his first fraction of radiotherapy but the patient's anatomy had change. The gross tumor volume within the right lung could not be aligned to the planned target volume. Therefore the decision was made that it is medically necessary to bring him back for a re\re simulation to further assess and plan for changes in his anatomy. The patient therefore was brought to the CT Simulation planning suite again.  Identity was confirmed.  All relevant records and images related to the planned course of therapy were reviewed.   Written consent to proceed with treatment was confirmed which was freely given after reviewing the details related to the planned course of therapy had been reviewed with the patient.  Then, the patient was set-up in a stable reproducible  supine position for radiation therapy.  CT images were obtained.  Surface markings were placed.  2 customized complex treatment devices were fashioned to help with patient immobilization, including a body fix bag and an accu-form device.  The CT images were loaded into the planning software.  Then the target and avoidance structures were contoured.  Treatment planning then occurred.  The radiation prescription was entered and confirmed.  A total of 5 complex treatment devices were fabricated which relate to the designed radiation treatment fields. Each of these customized fields/ complex treatment devices will be used on a daily basis during the radiation course. I have requested : Isodose Plan.   PLAN:  The patient will receive 60 Gy in 30 fractions initially. A 6 gray boost is anticipated.  ________________________________   Radene Gunning, MD, PhD

## 2012-12-21 NOTE — Progress Notes (Signed)
  Radiation Oncology         [phone]) [phone] ________________________________  Name: [name] MRN: [id number]  Date: 10/06/2012  DOB: 07-Jan-1946  SIMULATION AND TREATMENT PLANNING NOTE  DIAGNOSIS:  Lung cancer  NARRATIVE:  The patient was brought to the CT Simulation planning suite.  Identity was confirmed.  All relevant records and images related to the planned course of therapy were reviewed.   Written consent to proceed with treatment was confirmed which was freely given after reviewing the details related to the planned course of therapy had been reviewed with the patient.  Then, the patient was set-up in a stable reproducible supine position for radiation therapy.  The patient's arms were raised above using a wing board device. CT images were obtained.  An isocenter was placed within the chest in relation to the target volume. Surface markings were placed.    The CT images were loaded into the planning software.  Then the target and avoidance structures were contoured.  Treatment planning then occurred.  The radiation prescription was entered and confirmed.  A total of 5 complex treatment devices were fabricated which correspond to the designed customized radiation treatment fields. Each of these customized fields/ complex treatment devices will be used on a daily basis during the radiation course. I have requested a 3D Simulation.  I have requested a DVH of the following structures: target volume, spinal cord, lungs, esophagus.   PLAN:  The patient will initially receive 60 Gy in 30 fractions. It is anticipated that the patient will then received a 6 gray boost. The patient's final total dose will be 66 gray.   The patient will receive chemotherapy during the course of radiation treatment. The patient may experience increased or overlapping toxicity due to this combined-modality approach and the patient will be monitored for such problems. This may include extra lab work as necessary.  This therefore constitutes a special treatment procedure.   ________________________________   Radene Gunning, MD, PhD

## 2013-01-04 ENCOUNTER — Ambulatory Visit
Admission: RE | Admit: 2013-01-04 | Discharge: 2013-01-04 | Disposition: A | Discharge status: Home / Self Care | Payer: Medicare Other | Source: Ambulatory Visit | Attending: Radiation Oncology | Admitting: Radiation Oncology

## 2013-01-04 NOTE — Progress Notes (Signed)
Patient here for routine one month follow up completion of recurrent lung cancer.Denies pain, shortness of breath or cough.Skin has healed, mild hyperpigmentation anteriorly but no longer dry or peeling posteriorly.

## 2013-01-04 NOTE — Progress Notes (Signed)
Radiation Oncology         [phone]) [phone] ________________________________  Name: [name] MRN: [id number]  Date: 01/04/2013  DOB: 09-01-1946  Follow-Up Visit Note  CC: Conni Slipper, PA-C  Saunders Revel, MD  Diagnosis:   Lung cancer  Interval Since Last Radiation:  One month   Narrative:  The patient returns today for routine follow-up.  The patient indicates that he is done fine since he finished treatment. No worsening shortness of breath. No esophagitis or other related symptoms regarding acute toxicity.                              ALLERGIES:  has No Known Allergies.  Meds: Current Outpatient Prescriptions  Medication Sig Dispense Refill  . acetaminophen (TYLENOL) 325 MG tablet Take 650 mg by mouth every 4 (four) hours as needed.      . Artificial Tear Ointment (AKWA TEARS) 0.5 % ophthalmic ointment Place into the right eye as needed.      Marland Kitchen atorvastatin (LIPITOR) 20 MG tablet Take 20 mg by mouth daily.      . famciclovir (FAMVIR) 500 MG tablet Take 1 tablet (500 mg total) by mouth 3 (three) times daily.  21 tablet  0  . Fluticasone-Salmeterol (ADVAIR) 100-50 MCG/DOSE AEPB Inhale 1 puff into the lungs every 12 (twelve) hours.      Marland Kitchen HYDROcodone-acetaminophen (NORCO/VICODIN) 5-325 MG per tablet Take 1 tablet by mouth every 4 (four) hours as needed.      . Lacosamide (VIMPAT) 100 MG TABS Take 1 tablet by mouth 2 (two) times daily.      Marland Kitchen levETIRAcetam (KEPPRA) 1000 MG tablet Take 1,000 mg by mouth 2 (two) times daily.      Marland Kitchen lidocaine-prilocaine (EMLA) cream Apply topically as needed. Apply to port approx 1 hour before chemo appt, cover with tegaderm.  30 g  0  . mirtazapine (REMERON SOL-TAB) 15 MG disintegrating tablet Take 15 mg by mouth at bedtime.      . Multiple Vitamin (MULTIVITAMIN WITH MINERALS) TABS Take 1 tablet by mouth daily.      . nitroGLYCERIN (NITROSTAT) 0.4 MG SL tablet Place 0.4 mg under the tongue every 5 (five) minutes as needed.      . phenytoin  (DILANTIN) 100 MG ER capsule Take 100 mg by mouth 2 (two) times daily. Take 2 capsules bid      . pregabalin (LYRICA) 150 MG capsule Take 150 mg by mouth daily.      . prochlorperazine (COMPAZINE) 10 MG tablet Take 1 tablet (10 mg total) by mouth every 6 (six) hours as needed.  60 tablet  0  . sennosides-docusate sodium (SENOKOT-S) 8.6-50 MG tablet Take 1 tablet by mouth as directed.      . Vardenafil HCl (LEVITRA PO) Take by mouth.      . vitamin C (ASCORBIC ACID) 500 MG tablet Take 500 mg by mouth daily.       No current facility-administered medications for this encounter.    Physical Findings: The patient is in no acute distress. Patient is alert and oriented.  temperature is 98.6 F (37 C). His blood pressure is 112/70 and his pulse is 87. His respiration is 20 and oxygen saturation is 99%. .   General: Well-developed, in no acute distress HEENT: Normocephalic, atraumatic Cardiovascular: Regular rate and rhythm Respiratory: Clear to auscultation bilaterally GI: Soft, nontender, normal bowel sounds Extremities: No edema present  Lab Findings: Lab Results  Component Value Date   WBC 2.9* 12/13/2012   HGB 14.4 12/13/2012   HCT 42.7 12/13/2012   MCV 99.1* 12/13/2012   PLT 126* 12/13/2012     Radiographic Findings: Ct Chest W Contrast  12/13/2012  *RADIOLOGY REPORT*  Clinical Data: Lung cancer restaging.  CT CHEST WITH CONTRAST  Technique:  Multidetector CT imaging of the chest was performed following the standard protocol during bolus administration of intravenous contrast.  Contrast: 80mL OMNIPAQUE IOHEXOL 300 MG/ML  SOLN  Comparison: None.  Findings: The tip of the right-sided Port-A-Cath is positioned at the junction of the SVC and RA.  There is no axillary lymphadenopathy.  1.6 cm necrotic short-axis right paratracheal lymph node is noted.  1.6 cm short-axis precarinal lymph node also shows central necrosis.  No hilar lymphadenopathy.  Heart size is normal.  The patient is status  post CABG.  No pericardial or pleural effusion.  Lung windows demonstrate to a 1.8 x 1.8 cm spiculated right upper lobe nodule with some postobstructive airspace disease.  Lung volumes are low bilaterally with probable compressive atelectasis in the dependent upper and lower lobes.  No parenchymal nodule or mass in the left lung.  Bone windows reveal no worrisome lytic or sclerotic osseous lesions.  IMPRESSION: 1.8 cm spiculated right upper lobe nodule consistent with neoplasm. There is necrotic lymphadenopathy in the right mediastinum.  No evidence for bulky lymphadenopathy in the right hilum.   Original Report Authenticated By: Kennith Center, M.D.     Impression:    The patient seems to be doing well at this time. He is scheduled for a CT scan next month with subsequent evaluation in medical oncology.  Plan:  The patient will followup in 3 months for ongoing followup.   Radene Gunning, M.D., Ph.D.

## 2013-01-25 ENCOUNTER — Encounter (HOSPITAL_COMMUNITY): Payer: Self-pay

## 2013-01-25 ENCOUNTER — Ambulatory Visit (HOSPITAL_COMMUNITY)
Admission: RE | Admit: 2013-01-25 | Discharge: 2013-01-25 | Disposition: A | Discharge status: Home / Self Care | Payer: Medicare Other | Source: Ambulatory Visit | Attending: Internal Medicine | Admitting: Internal Medicine

## 2013-01-25 ENCOUNTER — Other Ambulatory Visit (HOSPITAL_BASED_OUTPATIENT_CLINIC_OR_DEPARTMENT_OTHER): Payer: Medicare Other | Admitting: Lab

## 2013-01-25 DIAGNOSIS — C61 Malignant neoplasm of prostate: Secondary | ICD-10-CM | POA: Insufficient documentation

## 2013-01-25 DIAGNOSIS — C3490 Malignant neoplasm of unspecified part of unspecified bronchus or lung: Secondary | ICD-10-CM | POA: Insufficient documentation

## 2013-01-25 DIAGNOSIS — N281 Cyst of kidney, acquired: Secondary | ICD-10-CM | POA: Insufficient documentation

## 2013-01-25 DIAGNOSIS — C3410 Malignant neoplasm of upper lobe, unspecified bronchus or lung: Secondary | ICD-10-CM

## 2013-01-25 DIAGNOSIS — I517 Cardiomegaly: Secondary | ICD-10-CM | POA: Insufficient documentation

## 2013-01-25 LAB — CBC WITH DIFFERENTIAL/PLATELET
Basophils Absolute: 0 10*3/uL (ref 0.0–0.1)
EOS%: 14.9 % — ABNORMAL HIGH (ref 0.0–7.0)
Eosinophils Absolute: 0.6 10*3/uL — ABNORMAL HIGH (ref 0.0–0.5)
HCT: 46.2 % (ref 38.4–49.9)
HGB: 15.6 g/dL (ref 13.0–17.1)
MCH: 33.7 pg — ABNORMAL HIGH (ref 27.2–33.4)
NEUT#: 2.8 10*3/uL (ref 1.5–6.5)
NEUT%: 64 % (ref 39.0–75.0)
RDW: 14.5 % (ref 11.0–14.6)
lymph#: 0.4 10*3/uL — ABNORMAL LOW (ref 0.9–3.3)

## 2013-01-25 LAB — COMPREHENSIVE METABOLIC PANEL (CC13)
Albumin: 3.4 g/dL — ABNORMAL LOW (ref 3.5–5.0)
BUN: 7.7 mg/dL (ref 7.0–26.0)
CO2: 28 mEq/L (ref 22–29)
Calcium: 9.2 mg/dL (ref 8.4–10.4)
Chloride: 105 mEq/L (ref 98–107)
Creatinine: 0.8 mg/dL (ref 0.7–1.3)
Glucose: 77 mg/dl (ref 70–99)
Potassium: 3.7 mEq/L (ref 3.5–5.1)

## 2013-01-25 MED ORDER — IOHEXOL 300 MG/ML  SOLN
80.0000 mL | Freq: Once | INTRAMUSCULAR | Status: AC | PRN
  Administered 2013-01-25: 80 mL via INTRAVENOUS

## 2013-01-29 ENCOUNTER — Ambulatory Visit (HOSPITAL_BASED_OUTPATIENT_CLINIC_OR_DEPARTMENT_OTHER): Payer: Medicare Other | Admitting: Internal Medicine

## 2013-01-29 ENCOUNTER — Telehealth: Payer: Self-pay | Admitting: Internal Medicine

## 2013-01-29 ENCOUNTER — Encounter: Payer: Self-pay | Admitting: Internal Medicine

## 2013-01-29 DIAGNOSIS — C3410 Malignant neoplasm of upper lobe, unspecified bronchus or lung: Secondary | ICD-10-CM

## 2013-01-29 NOTE — Patient Instructions (Addendum)
No evidence for disease progression on his recent scan. Follow up visit in 3 months with repeat CT scan of the chest

## 2013-01-29 NOTE — Progress Notes (Signed)
[facility] [phone]   [phone]  OFFICE PROGRESS NOTE  JOHNSON, ADRENA E, PA-C [address] [facility] [name] [address]  DIAGNOSIS: stage IIIA (T1a, N2, M0) non-small cell lung cancer, adenocarcinoma diagnosed in December of 2013.   PRIOR THERAPY:  concurrent chemoradiation with weekly carboplatin for AUC of 2 and paclitaxel 45 mg/M2, status post 4 weekly doses of chemotherapy last dose was given on 11/06/2012 and last fraction of radiotherapy 11/07/2012.  CURRENT THERAPY: Observation.  INTERVAL HISTORY: [name] 67 y.o. male returns to the clinic today for followup visit accompanied his caregiver. The patient is currently a resident of the [facility]. He denied having any significant chest pain, shortness breath, cough or hemoptysis. He denied having any weight loss or night sweats. He has no nausea or vomiting. He has no fever or chills. He had repeat CT scan of the chest performed recently and he is here today for evaluation and discussion of his scan results.  MEDICAL HISTORY: Past Medical History  Diagnosis Date  . Prostate cancer 04/22/09    Adenomacarcinoma,glleason=#=#=6,volume=32.9cc,PSA=5.91  . Gout     hx  . Myocardial infarction   . Anxiety   . Depression   . PVD (peripheral vascular disease)   . Stroke     hx syndrome  . Seizures   . Hypertension   . Hypercholesterolemia   . History of radiation therapy 08/25/2009-10/28/2009    prostate 78 Gy/40 fractions  . Lung cancer 08/29/12    bx= right  upper outer lobe lung=Adenocarcinoma  . Anemia   . CAD (coronary artery disease)   . Gangrene     hx  . Hemiparesis     left , and contracture lue  . Sinus tachycardia     hx  . Seizure 10/03/2012  . CAD (coronary artery disease) 10/03/2012  . HTN (hypertension) 10/03/2012  . Stroke 10/03/2012    ALLERGIES:  has No Known Allergies.  MEDICATIONS:  Current Outpatient  Prescriptions  Medication Sig Dispense Refill  . acetaminophen (TYLENOL) 325 MG tablet Take 650 mg by mouth every 4 (four) hours as needed.      . Artificial Tear Ointment (AKWA TEARS) 0.5 % ophthalmic ointment Place into the right eye as needed.      Marland Kitchen atorvastatin (LIPITOR) 20 MG tablet Take 20 mg by mouth daily.      . famciclovir (FAMVIR) 500 MG tablet Take 1 tablet (500 mg total) by mouth 3 (three) times daily.  21 tablet  0  . Fluticasone-Salmeterol (ADVAIR) 100-50 MCG/DOSE AEPB Inhale 1 puff into the lungs every 12 (twelve) hours.      Marland Kitchen HYDROcodone-acetaminophen (NORCO/VICODIN) 5-325 MG per tablet Take 1 tablet by mouth every 4 (four) hours as needed.      . Lacosamide (VIMPAT) 100 MG TABS Take 1 tablet by mouth 2 (two) times daily.      Marland Kitchen levETIRAcetam (KEPPRA) 1000 MG tablet Take 1,000 mg by mouth 2 (two) times daily.      Marland Kitchen lidocaine-prilocaine (EMLA) cream Apply topically as needed. Apply to port approx 1 hour before chemo appt, cover with tegaderm.  30 g  0  . mirtazapine (REMERON SOL-TAB) 15 MG disintegrating tablet Take 15 mg by mouth at bedtime.      . Multiple Vitamin (MULTIVITAMIN WITH MINERALS) TABS Take 1 tablet by mouth daily.      . nitroGLYCERIN (NITROSTAT) 0.4 MG SL tablet Place 0.4  mg under the tongue every 5 (five) minutes as needed.      . phenytoin (DILANTIN) 100 MG ER capsule Take 100 mg by mouth 2 (two) times daily. Take 2 capsules bid      . pregabalin (LYRICA) 150 MG capsule Take 150 mg by mouth daily.      . prochlorperazine (COMPAZINE) 10 MG tablet Take 1 tablet (10 mg total) by mouth every 6 (six) hours as needed.  60 tablet  0  . sennosides-docusate sodium (SENOKOT-S) 8.6-50 MG tablet Take 1 tablet by mouth as directed.      . Vardenafil HCl (LEVITRA PO) Take by mouth.      . vitamin C (ASCORBIC ACID) 500 MG tablet Take 500 mg by mouth daily.       No current facility-administered medications for this visit.    SURGICAL HISTORY:  Past Surgical History    Procedure Laterality Date  . 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 Lung biopsy  08/29/12    RUO lung=Adenocarcinoma  . Prostate biopsy  04/22/09    Adenocarcinoma    REVIEW OF SYSTEMS:  A comprehensive review of systems was negative.   PHYSICAL EXAMINATION: General appearance: alert, cooperative and no distress Head: Normocephalic, without obvious abnormality, atraumatic Neck: no adenopathy Lymph nodes: Cervical, supraclavicular, and axillary nodes normal. Resp: clear to auscultation bilaterally Cardio: regular rate and rhythm, S1, S2 normal, no murmur, click, rub or gallop GI: soft, non-tender; bowel sounds normal; no masses,  no organomegaly Extremities: Right lower extremity above knee amputation  ECOG PERFORMANCE STATUS: 1 - Symptomatic but completely ambulatory  Blood pressure 119/65, pulse 91, temperature 99 F (37.2 C), temperature source Oral, resp. rate 18, height 5\' 8"  (1.727 m), weight 139 lb 6.4 oz (63.231 kg).  LABORATORY DATA: Lab Results  Component Value Date   WBC 4.3 01/25/2013   HGB 15.6 01/25/2013   HCT 46.2 01/25/2013   MCV 100.0* 01/25/2013   PLT 144 01/25/2013      Chemistry      Component Value Date/Time   NA 143 01/25/2013 0857   NA 140 03/10/2009 1220   K 3.7 01/25/2013 0857   K 4.2 03/10/2009 1220   CL 105 01/25/2013 0857   CL 109 03/10/2009 1220   CO2 28 01/25/2013 0857   CO2 26 03/10/2009 1220   BUN 7.7 01/25/2013 0857   BUN 13 03/10/2009 1220   CREATININE 0.8 01/25/2013 0857   CREATININE 0.88 03/10/2009 1220      Component Value Date/Time   CALCIUM 9.2 01/25/2013 0857   CALCIUM 8.8 03/10/2009 1220   ALKPHOS 141 01/25/2013 0857   ALKPHOS 57 10/25/2007 0545   AST 17 01/25/2013 0857   AST 97* 10/25/2007 0545   ALT 15 01/25/2013 0857   ALT 32 10/25/2007 0545   BILITOT 0.35 01/25/2013 0857   BILITOT 0.8 10/25/2007 0545       RADIOGRAPHIC STUDIES: Ct Chest W Contrast  01/25/2013  *RADIOLOGY REPORT*  Clinical Data: Lung cancer diagnosed  12/13.  Chemotherapy and radiation therapy complete.  Prostate cancer in 2010.  Ex-smoker. Prior radiation therapy  CT CHEST WITH CONTRAST  Technique:  Multidetector CT imaging of the chest was performed following the standard protocol during bolus administration of intravenous contrast.  Contrast: 80mL OMNIPAQUE IOHEXOL 300 MG/ML  SOLN  Comparison: 12/13/2012.  Plate and 16/06/9603  Findings: Lungs/pleura: Mild degradation secondary to motion and patient left arm position, not raised above  the head.  Right upper lobe lung nodule measures 1.7 x 1.4 cm on image 18/series 5 versus similar to on the prior (when remeasured).  Minimal surrounding radiation change.  Normal left lung. No pleural fluid.  Heart/Mediastinum: No supraclavicular adenopathy.  Right-sided Port- A-Cath which terminates at the right atrium on this supine exam. Prior median sternotomy.  Similar small left axillary nodes.  Mild cardiomegaly, without pericardial effusion. No central pulmonary embolism, on this non-dedicated study.  Necrotic right paratracheal node measures 2.0 x 2.5 cm on image 18/series 2 versus similar to on the prior.  More cephalad necrotic right paratracheal node measures 1.5 cm on image 16/series 2 and is unchanged.  No hilar adenopathy. No central pulmonary embolism, on this non- dedicated study.  There is minimal nodal tissue on right infrahilar region on image 29/series 2, unchanged.  Upper abdomen: Tiny upper pole left renal cyst. Normal adrenal glands.  Bones/Musculoskeletal:  No acute osseous abnormality.  IMPRESSION:  1.  No change in right upper lobe lung nodule. 2.  No change in the necrotic adenopathy in the right mediastinum. 3.  No new or progressive disease. 4.  Minimally motion and position degraded exam.   Original Report Authenticated By: Jeronimo Greaves, M.D.     ASSESSMENT: This is a very pleasant 67 years old African American male with history of stage IIIa non-small cell lung cancer, adenocarcinoma status post  concurrent chemoradiation with weekly carboplatin and paclitaxel with stable disease on the recent scan   PLAN: I discussed the scan results with the patient today. I recommended for him to continue on observation for now with repeat CT scan of the chest in 3 months. He was advised to call immediately if he has any concerning symptoms in the interval.  All questions were answered. The patient knows to call the clinic with any problems, questions or concerns. We can certainly see the patient much sooner if necessary.

## 2013-02-08 ENCOUNTER — Other Ambulatory Visit: Payer: Self-pay | Admitting: *Deleted

## 2013-02-08 MED ORDER — LACOSAMIDE 100 MG PO TABS: ORAL_TABLET | ORAL | Status: DC

## 2013-02-10 ENCOUNTER — Non-Acute Institutional Stay (SKILLED_NURSING_FACILITY): Payer: PRIVATE HEALTH INSURANCE | Admitting: Internal Medicine

## 2013-02-10 DIAGNOSIS — L89209 Pressure ulcer of unspecified hip, unspecified stage: Secondary | ICD-10-CM

## 2013-02-16 NOTE — Progress Notes (Signed)
Patient ID: Kyel Purk, male   DOB: 1946/03/26, 67 y.o.   MRN: [id number]           PROGRESS NOTE  DATE:  02/09/2013  FACILITY: [facility]   LEVEL OF CARE:   SNF   Acute Visit   CHIEF COMPLAINT:  Review of wound over the right ischial tuberosity.    HISTORY OF PRESENT ILLNESS:  This is a patient who is very disabled, bed- and wheelchair-bound.  He has developed an open area which is a small sinus.  There is no surrounding soft tissue infection here.  However, this has roughly 3 cm of depth.  Culture has already been done, growing both anaerobic culture as well as gram negative rods and staph aureus, sensitivities not available.    ASSESSMENT/PLAN:  Wound over the right ischial tuberosity.  This has the appearance of a possible draining sinus.  I would change his antibiotics to clindamycin and Septra, pending final culture results.   Dress the wound with silver alginate packing daily.    CPT CODE: 40981 Immunologist)

## 2013-02-27 ENCOUNTER — Telehealth: Payer: Self-pay | Admitting: *Deleted

## 2013-02-27 NOTE — Telephone Encounter (Signed)
Faxed over calendar and CT instructions for August appts to pt's RN at [facility] [phone] attn Purvis Kilts.  SLJ

## 2013-04-18 ENCOUNTER — Non-Acute Institutional Stay (SKILLED_NURSING_FACILITY): Payer: PRIVATE HEALTH INSURANCE | Admitting: Internal Medicine

## 2013-04-18 DIAGNOSIS — G609 Hereditary and idiopathic neuropathy, unspecified: Secondary | ICD-10-CM

## 2013-04-18 DIAGNOSIS — J309 Allergic rhinitis, unspecified: Secondary | ICD-10-CM

## 2013-04-18 DIAGNOSIS — R569 Unspecified convulsions: Secondary | ICD-10-CM

## 2013-04-18 DIAGNOSIS — E7800 Pure hypercholesterolemia, unspecified: Secondary | ICD-10-CM

## 2013-04-30 ENCOUNTER — Ambulatory Visit (HOSPITAL_COMMUNITY)
Admission: RE | Admit: 2013-04-30 | Discharge: 2013-04-30 | Disposition: A | Discharge status: Home / Self Care | Payer: PRIVATE HEALTH INSURANCE | Source: Ambulatory Visit | Attending: Internal Medicine | Admitting: Internal Medicine

## 2013-04-30 ENCOUNTER — Other Ambulatory Visit (HOSPITAL_BASED_OUTPATIENT_CLINIC_OR_DEPARTMENT_OTHER): Payer: PRIVATE HEALTH INSURANCE | Admitting: Lab

## 2013-04-30 ENCOUNTER — Other Ambulatory Visit: Payer: Medicare Other | Admitting: Lab

## 2013-04-30 DIAGNOSIS — C3410 Malignant neoplasm of upper lobe, unspecified bronchus or lung: Secondary | ICD-10-CM

## 2013-04-30 DIAGNOSIS — R599 Enlarged lymph nodes, unspecified: Secondary | ICD-10-CM | POA: Insufficient documentation

## 2013-04-30 DIAGNOSIS — Z923 Personal history of irradiation: Secondary | ICD-10-CM | POA: Insufficient documentation

## 2013-04-30 DIAGNOSIS — Z9221 Personal history of antineoplastic chemotherapy: Secondary | ICD-10-CM | POA: Insufficient documentation

## 2013-04-30 DIAGNOSIS — I2510 Atherosclerotic heart disease of native coronary artery without angina pectoris: Secondary | ICD-10-CM | POA: Insufficient documentation

## 2013-04-30 DIAGNOSIS — R918 Other nonspecific abnormal finding of lung field: Secondary | ICD-10-CM | POA: Insufficient documentation

## 2013-04-30 DIAGNOSIS — J90 Pleural effusion, not elsewhere classified: Secondary | ICD-10-CM | POA: Insufficient documentation

## 2013-04-30 DIAGNOSIS — I700 Atherosclerosis of aorta: Secondary | ICD-10-CM | POA: Insufficient documentation

## 2013-04-30 DIAGNOSIS — C3490 Malignant neoplasm of unspecified part of unspecified bronchus or lung: Secondary | ICD-10-CM | POA: Insufficient documentation

## 2013-04-30 DIAGNOSIS — Z951 Presence of aortocoronary bypass graft: Secondary | ICD-10-CM | POA: Insufficient documentation

## 2013-04-30 LAB — CBC WITH DIFFERENTIAL/PLATELET
Basophils Absolute: 0 10*3/uL (ref 0.0–0.1)
EOS%: 6.8 % (ref 0.0–7.0)
HCT: 44.6 % (ref 38.4–49.9)
HGB: 14.8 g/dL (ref 13.0–17.1)
MCH: 32.2 pg (ref 27.2–33.4)
MONO#: 0.5 10*3/uL (ref 0.1–0.9)
NEUT#: 3.8 10*3/uL (ref 1.5–6.5)
NEUT%: 70.8 % (ref 39.0–75.0)
RDW: 14 % (ref 11.0–14.6)
WBC: 5.4 10*3/uL (ref 4.0–10.3)
lymph#: 0.7 10*3/uL — ABNORMAL LOW (ref 0.9–3.3)

## 2013-04-30 LAB — COMPREHENSIVE METABOLIC PANEL (CC13)
ALT: 34 U/L (ref 0–55)
AST: 23 U/L (ref 5–34)
Albumin: 3 g/dL — ABNORMAL LOW (ref 3.5–5.0)
BUN: 10.1 mg/dL (ref 7.0–26.0)
CO2: 25 mEq/L (ref 22–29)
Calcium: 9.4 mg/dL (ref 8.4–10.4)
Chloride: 104 mEq/L (ref 98–109)
Potassium: 4 mEq/L (ref 3.5–5.1)

## 2013-04-30 MED ORDER — IOHEXOL 300 MG/ML  SOLN
80.0000 mL | Freq: Once | INTRAMUSCULAR | Status: AC | PRN
  Administered 2013-04-30: 80 mL via INTRAVENOUS

## 2013-05-07 ENCOUNTER — Ambulatory Visit (HOSPITAL_BASED_OUTPATIENT_CLINIC_OR_DEPARTMENT_OTHER): Payer: PRIVATE HEALTH INSURANCE | Admitting: Internal Medicine

## 2013-05-07 ENCOUNTER — Telehealth: Payer: Self-pay | Admitting: Internal Medicine

## 2013-05-07 ENCOUNTER — Encounter: Payer: Self-pay | Admitting: Internal Medicine

## 2013-05-07 DIAGNOSIS — C3410 Malignant neoplasm of upper lobe, unspecified bronchus or lung: Secondary | ICD-10-CM

## 2013-05-07 NOTE — Progress Notes (Signed)
[facility] [phone]   [phone]  OFFICE PROGRESS NOTE  JOHNSON, ADRENA E, PA-C [address] [facility] [place] [address]  DIAGNOSIS: stage IIIA (T1a, N2, M0) non-small cell lung cancer, adenocarcinoma diagnosed in December of 2013.   PRIOR THERAPY: concurrent chemoradiation with weekly carboplatin for AUC of 2 and paclitaxel 45 mg/M2, status post 4 weekly doses of chemotherapy last dose was given on 11/06/2012 and last fraction of radiotherapy 11/07/2012.   CURRENT THERAPY: Observation.  INTERVAL HISTORY: [name] 67 y.o. male returns to the clinic today for followup visit. The patient is feeling fine today with no specific complaints. He denied having any significant chest pain, shortness of breath, cough or hemoptysis. The patient denied having any nausea or vomiting and no significant weight loss. He had repeat CT scan of the chest performed recently and he is here for evaluation and discussion of his scan results. He is resident of the [facility] skilled nursing facility.  MEDICAL HISTORY: Past Medical History  Diagnosis Date  . Prostate cancer 04/22/09    Adenomacarcinoma,glleason=#=#=6,volume=32.9cc,PSA=5.91  . Gout     hx  . Myocardial infarction   . Anxiety   . Depression   . PVD (peripheral vascular disease)   . Stroke     hx syndrome  . Seizures   . Hypertension   . Hypercholesterolemia   . History of radiation therapy 08/25/2009-10/28/2009    prostate 78 Gy/40 fractions  . Lung cancer 08/29/12    bx= right  upper outer lobe lung=Adenocarcinoma  . Anemia   . CAD (coronary artery disease)   . Gangrene     hx  . Hemiparesis     left , and contracture lue  . Sinus tachycardia     hx  . Seizure 10/03/2012  . CAD (coronary artery disease) 10/03/2012  . HTN (hypertension) 10/03/2012  . Stroke 10/03/2012    ALLERGIES:  has No Known Allergies.  MEDICATIONS:  Current Outpatient Prescriptions    Medication Sig Dispense Refill  . acetaminophen (TYLENOL) 325 MG tablet Take 650 mg by mouth every 4 (four) hours as needed.      Marland Kitchen aspirin 81 MG tablet Take 81 mg by mouth daily.      Marland Kitchen atorvastatin (LIPITOR) 20 MG tablet Take 20 mg by mouth daily.      . Cholecalciferol (VITAMIN D PO) Take by mouth daily. D 3-50 50000 units      . FLORA-Q (FLORA-Q) CAPS capsule Take 1 capsule by mouth daily.      . Fluticasone-Salmeterol (ADVAIR) 100-50 MCG/DOSE AEPB Inhale 1 puff into the lungs every 12 (twelve) hours.      Marland Kitchen HYDROcodone-acetaminophen (NORCO/VICODIN) 5-325 MG per tablet Take 1 tablet by mouth every 4 (four) hours as needed.      Marland Kitchen ipratropium-albuterol (DUONEB) 0.5-2.5 (3) MG/3ML SOLN Take 3 mLs by nebulization every 6 (six) hours as needed.      . Lacosamide (VIMPAT) 100 MG TABS Take 1 tablet by mouth 2 (two) times daily.      . Lacosamide (VIMPAT) 100 MG TABS Take 1 tablet twice a day  60 tablet  5  . levETIRAcetam (KEPPRA) 1000 MG tablet Take 1,000 mg by mouth 2 (two) times daily.      Marland Kitchen loratadine (CLARITIN) 10 MG tablet Take 10 mg by mouth daily.      . mirtazapine (REMERON SOL-TAB) 15 MG disintegrating tablet Take 15 mg by mouth at  bedtime.      . phenytoin (DILANTIN) 100 MG ER capsule Take 100 mg by mouth 2 (two) times daily. Take 2 capsules bid      . pregabalin (LYRICA) 150 MG capsule Take 150 mg by mouth daily.      . sennosides-docusate sodium (SENOKOT-S) 8.6-50 MG tablet Take 1 tablet by mouth as directed.      . vitamin C (ASCORBIC ACID) 500 MG tablet Take 500 mg by mouth daily.      . Artificial Tear Ointment (AKWA TEARS) 0.5 % ophthalmic ointment Place into the right eye as needed.      . famciclovir (FAMVIR) 500 MG tablet Take 1 tablet (500 mg total) by mouth 3 (three) times daily.  21 tablet  0  . lidocaine-prilocaine (EMLA) cream Apply topically as needed. Apply to port approx 1 hour before chemo appt, cover with tegaderm.  30 g  0  . Multiple Vitamin (MULTIVITAMIN WITH  MINERALS) TABS Take 1 tablet by mouth daily.      . nitroGLYCERIN (NITROSTAT) 0.4 MG SL tablet Place 0.4 mg under the tongue every 5 (five) minutes as needed.      . prochlorperazine (COMPAZINE) 10 MG tablet Take 1 tablet (10 mg total) by mouth every 6 (six) hours as needed.  60 tablet  0  . Vardenafil HCl (LEVITRA PO) Take by mouth.       No current facility-administered medications for this visit.    SURGICAL HISTORY:  Past Surgical History  Procedure Laterality Date  . 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 Lung biopsy  08/29/12    RUO lung=Adenocarcinoma  . Prostate biopsy  04/22/09    Adenocarcinoma    REVIEW OF SYSTEMS:  A comprehensive review of systems was negative.   PHYSICAL EXAMINATION: General appearance: alert, cooperative and no distress Head: Normocephalic, without obvious abnormality, atraumatic Neck: no adenopathy Lymph nodes: Cervical, supraclavicular, and axillary nodes normal. Resp: clear to auscultation bilaterally Cardio: regular rate and rhythm, S1, S2 normal, no murmur, click, rub or gallop GI: soft, non-tender; bowel sounds normal; no masses,  no organomegaly Extremities: extremities normal, atraumatic, no cyanosis or edema and Left AKA  ECOG PERFORMANCE STATUS: 1 - Symptomatic but completely ambulatory  Blood pressure 102/59, pulse 97, temperature 98.7 F (37.1 C), temperature source Oral, resp. rate 19, height 5\' 8"  (1.727 m), weight 0 lb (0 kg), SpO2 98.00%.  LABORATORY DATA: Lab Results  Component Value Date   WBC 5.4 04/30/2013   HGB 14.8 04/30/2013   HCT 44.6 04/30/2013   MCV 97.1 04/30/2013   PLT 158 04/30/2013      Chemistry      Component Value Date/Time   NA 139 04/30/2013 0921   NA 140 03/10/2009 1220   K 4.0 04/30/2013 0921   K 4.2 03/10/2009 1220   CL 105 01/25/2013 0857   CL 109 03/10/2009 1220   CO2 25 04/30/2013 0921   CO2 26 03/10/2009 1220   BUN 10.1 04/30/2013 0921   BUN 13 03/10/2009 1220   CREATININE 0.8  04/30/2013 0921   CREATININE 0.88 03/10/2009 1220      Component Value Date/Time   CALCIUM 9.4 04/30/2013 0921   CALCIUM 8.8 03/10/2009 1220   ALKPHOS 107 04/30/2013 0921   ALKPHOS 57 10/25/2007 0545   AST 23 04/30/2013 0921   AST 97* 10/25/2007 0545   ALT 34 04/30/2013 0921   ALT 32 10/25/2007 0545  BILITOT 0.37 04/30/2013 0921   BILITOT 0.8 10/25/2007 0545       RADIOGRAPHIC STUDIES: Ct Chest W Contrast  04/30/2013   *RADIOLOGY REPORT*  Clinical Data: History of lung cancer status post chemotherapy and radiation therapy which is now complete.  Restaging scan.  CT CHEST WITH CONTRAST  Technique:  Multidetector CT imaging of the chest was performed following the standard protocol during bolus administration of intravenous contrast.  Contrast: 80mL OMNIPAQUE IOHEXOL 300 MG/ML  SOLN  Comparison: Chest CT 01/25/2013.  Findings:  Mediastinum: Right internal jugular single lumen Port-A-Cath with tip terminating in the right atrium. Heart size is normal. There is no significant pericardial fluid, thickening or pericardial calcification. There is atherosclerosis of the thoracic aorta, the great vessels of the mediastinum and the coronary arteries, including calcified atherosclerotic plaque in the left main, left anterior descending, left circumflex and right coronary arteries. Status post median sternotomy for CABG, including a LIMA to the LAD.  Enlarged low right paratracheal lymph node appears slightly larger than the prior examination, currently measuring up to 2.7 x 2.3 cm (image 19 of series 2).  Prominent subcarinal nodal tissue is also noted measuring up to 12 mm in short axis.  Extensive peribronchovascular soft tissue is noted surrounding multiple bronchi extending to the right upper and lower lobes. Esophagus is unremarkable in appearance.  Lungs/Pleura: In the right upper lobe the previously noted right upper lobe pulmonary nodule appears less distinct and slightly less bulky than the prior examination, and is  now it is surrounded by extensive air space consolidation (making accurate measurement difficult), septal thickening and architectural distortion, presumably postradiation changes.  These findings affect portions of both the posterior right upper lobe as well as the superior segment of the right lower lobe.  In the central aspect of the right lower lobe (image 30 of series 2) there is increasing nodular soft tissue thickening that currently measures 2.8 x 1.3 cm.  Small right pleural effusion layering dependently is simple in appearance and new compared to the prior examination.  Linear opacities in the left lower lobe are similar to the prior study, most compatible with areas of mild scarring.  Upper Abdomen: Unremarkable.  Musculoskeletal: Sternotomy wires. There are no aggressive appearing lytic or blastic lesions noted in the visualized portions of the skeleton.  IMPRESSION:  1.  Today's study demonstrates evolving postradiation changes in the upper right lung, with slight decreased prominence of the previously noted right upper lobe nodule which is now largely obscured by the adjacent postradiation changes. 2.  However, there is an enlarging nodule in the central aspect of the right lower lobe which currently measures 1.3 x 2.8 cm, enlarging low right paratracheal lymphadenopathy and a prominent subcarinal lymph node, as above, concerning for metastatic disease. 3.  New small right-sided pleural effusion is simple in appearance. 4. Atherosclerosis, including left main and three-vessel coronary artery disease.   Status post CABG.   Original Report Authenticated By: Trudie Reed, M.D.    ASSESSMENT AND PLAN: This is a pleasant 67 years old white male with history of stage IIIa non-small cell lung cancer status post concurrent chemoradiation has been observation since February 2014. The patient has evidence for disease recurrence in the right lung and lymphadenopathy. I discussed the scan results with the  patient. I recommended for him to have a PET scan for evaluation of his disease. I would see the patient back for followup visit in 2 weeks for evaluation and discussion of the PET  scan results and further recommendation regarding treatment of his condition. The patient was advised to call immediately if he has any concerning symptoms in the interval.  The patient voices understanding of current disease status and treatment options and is in agreement with the current care plan.  All questions were answered. The patient knows to call the clinic with any problems, questions or concerns. We can certainly see the patient much sooner if necessary.

## 2013-05-07 NOTE — Telephone Encounter (Signed)
Gave pt appt for MD only on 8/25,and gave referrral to Lilyan Punt for insurance precert

## 2013-05-11 ENCOUNTER — Other Ambulatory Visit: Payer: Self-pay | Admitting: Internal Medicine

## 2013-05-11 ENCOUNTER — Encounter (HOSPITAL_COMMUNITY)
Admission: RE | Admit: 2013-05-11 | Discharge: 2013-05-11 | Disposition: A | Discharge status: Home / Self Care | Payer: PRIVATE HEALTH INSURANCE | Source: Ambulatory Visit | Attending: Internal Medicine | Admitting: Internal Medicine

## 2013-05-11 ENCOUNTER — Other Ambulatory Visit: Payer: PRIVATE HEALTH INSURANCE | Admitting: Lab

## 2013-05-11 ENCOUNTER — Encounter (HOSPITAL_COMMUNITY): Payer: Self-pay

## 2013-05-11 DIAGNOSIS — C3410 Malignant neoplasm of upper lobe, unspecified bronchus or lung: Secondary | ICD-10-CM | POA: Insufficient documentation

## 2013-05-11 LAB — GLUCOSE, CAPILLARY: Glucose-Capillary: 88 mg/dL (ref 70–99)

## 2013-05-11 MED ORDER — FLUDEOXYGLUCOSE F - 18 (FDG) INJECTION
16.2000 | Freq: Once | INTRAVENOUS | Status: AC | PRN
  Administered 2013-05-11: 16.2 via INTRAVENOUS

## 2013-05-16 ENCOUNTER — Telehealth: Payer: Self-pay | Admitting: Internal Medicine

## 2013-05-16 NOTE — Telephone Encounter (Signed)
pt called to r/s appt....Done °

## 2013-05-17 ENCOUNTER — Encounter: Payer: Self-pay | Admitting: Internal Medicine

## 2013-05-17 DIAGNOSIS — J309 Allergic rhinitis, unspecified: Secondary | ICD-10-CM | POA: Insufficient documentation

## 2013-05-17 DIAGNOSIS — E7800 Pure hypercholesterolemia, unspecified: Secondary | ICD-10-CM | POA: Insufficient documentation

## 2013-05-17 DIAGNOSIS — G609 Hereditary and idiopathic neuropathy, unspecified: Secondary | ICD-10-CM | POA: Insufficient documentation

## 2013-05-17 HISTORY — DX: Allergic rhinitis, unspecified: J30.9

## 2013-05-17 NOTE — Progress Notes (Signed)
Patient ID: [name], male   DOB: 09-21-1946, 67 y.o.   MRN: [id number]        PROGRESS NOTE  DATE: 04/18/2013  FACILITY: Nursing Home Location: [facility]  LEVEL OF CARE: SNF (31)  Routine Visit  CHIEF COMPLAINT:  Manage seizure disorder, hyperlipidemia, and peripheral neuropathy.     HISTORY OF PRESENT ILLNESS:  REASSESSMENT OF ONGOING PROBLEM(S):  SEIZURE DISORDER: The patient's seizure disorder remains stable. No complications reported from the medications presently being used. Staff do not report any recent seizure activity.    HYPERLIPIDEMIA: No complications from the medications presently being used. Last fasting lipid panel showed :  In 01/2013:  Fasting lipid panel normal.    PERIPHERAL NEUROPATHY: The peripheral neuropathy is stable. The staff denies pain in the feet, tingling, and numbness. No complications noted from the medication presently being used.  PAST MEDICAL HISTORY : Reviewed.  No changes.  CURRENT MEDICATIONS: Reviewed per [facility]  REVIEW OF SYSTEMS:  Difficult to obtain.  Patient is a poor historian.    PHYSICAL EXAMINATION  VS:  T 97.5      P 82     RR 20     BP 102/60    POX %     WT (Lb) 141  GENERAL: no acute distress, normal body habitus EYES: conjunctivae normal, sclerae normal, normal eye lids NECK: supple, trachea midline, no neck masses, no thyroid tenderness, no thyromegaly LYMPHATICS: no LAN in the neck, no supraclavicular LAN RESPIRATORY: breathing is even & unlabored, BS CTAB CARDIAC: RRR, no murmur,no extra heart sounds, no edema GI: abdomen soft, normal BS, no masses, no tenderness, no hepatomegaly, no splenomegaly PSYCHIATRIC: the patient is alert & oriented to person, affect & behavior appropriate  LABS/RADIOLOGY: 01/2013:  Albumin 3, alkaline phosphatase 153, ALT 70, otherwise liver profile normal.    WBC 11, otherwise CBC normal.    Hemoglobin A1c 5.5.    BMP normal.    12/2012:  TSH 2.23.     11/2012:  Dilantin level 11.3.   ASSESSMENT/PLAN:  Seizure disorder.  Well controlled.    Hyperlipidemia.  Well controlled.    Peripheral neuropathy.  No complaints.    Allergic rhinitis.  Continue current medications.     Depression.  Continue Remeron.    Constipation.  Well controlled.    CPT CODE: 40981

## 2013-05-21 ENCOUNTER — Ambulatory Visit: Payer: PRIVATE HEALTH INSURANCE | Admitting: Internal Medicine

## 2013-05-21 ENCOUNTER — Non-Acute Institutional Stay (SKILLED_NURSING_FACILITY): Payer: PRIVATE HEALTH INSURANCE | Admitting: Internal Medicine

## 2013-05-21 DIAGNOSIS — E7800 Pure hypercholesterolemia, unspecified: Secondary | ICD-10-CM

## 2013-05-21 DIAGNOSIS — G609 Hereditary and idiopathic neuropathy, unspecified: Secondary | ICD-10-CM

## 2013-05-21 DIAGNOSIS — R569 Unspecified convulsions: Secondary | ICD-10-CM

## 2013-05-21 DIAGNOSIS — J309 Allergic rhinitis, unspecified: Secondary | ICD-10-CM

## 2013-05-23 NOTE — Progress Notes (Signed)
Patient ID: [name], male   DOB: 02-16-1946, 67 y.o.   MRN: [id number]        PROGRESS NOTE  DATE: 05/21/2013  FACILITY: Nursing Home Location: [facility]  LEVEL OF CARE: SNF (31)  Routine Visit  CHIEF COMPLAINT:  Manage seizure disorder, hyperlipidemia, and peripheral neuropathy.     HISTORY OF PRESENT ILLNESS:  REASSESSMENT OF ONGOING PROBLEM(S):  SEIZURE DISORDER: The patient's seizure disorder remains stable. No complications reported from the medications presently being used. Staff do not report any recent seizure activity.    HYPERLIPIDEMIA: No complications from the medications presently being used. Last fasting lipid panel showed :  In 01/2013:  Fasting lipid panel normal.    PERIPHERAL NEUROPATHY: The peripheral neuropathy is stable. The staff denies pain in the feet, tingling, and numbness. No complications noted from the medication presently being used.  PAST MEDICAL HISTORY : Reviewed.  No changes.  CURRENT MEDICATIONS: Reviewed per [facility]  REVIEW OF SYSTEMS:  Difficult to obtain.  Patient is a poor historian.    PHYSICAL EXAMINATION  VS:  T 97.8     P 60     RR 18    BP 120/62    POX %     WT (Lb) 136  GENERAL: no acute distress, normal body habitus EYES: conjunctivae normal, sclerae normal, normal eye lids NECK: supple, trachea midline, no neck masses, no thyroid tenderness, no thyromegaly LYMPHATICS: no LAN in the neck, no supraclavicular LAN RESPIRATORY: breathing is even & unlabored, BS CTAB CARDIAC: RRR, no murmur,no extra heart sounds, no edema GI: abdomen soft, normal BS, no masses, no tenderness, no hepatomegaly, no splenomegaly PSYCHIATRIC: the patient is alert & oriented to person, affect & behavior appropriate  LABS/RADIOLOGY: 8-14 Dilantin 6.7, albumin 3.6  01/2013:  Albumin 3, alkaline phosphatase 153, ALT 70, otherwise liver profile normal.    WBC 11, otherwise CBC normal.    Hemoglobin A1c 5.5.    BMP normal.     12/2012:  TSH 2.23.    11/2012:  Dilantin level 11.3.   ASSESSMENT/PLAN:  Seizure disorder.  Well controlled.    Hyperlipidemia.  Well controlled.    Peripheral neuropathy.  No complaints.    Allergic rhinitis.  Continue current medications.     Depression.  Continue Remeron.    Constipation.  Well controlled.    CPT CODE: 40981

## 2013-05-29 ENCOUNTER — Ambulatory Visit (HOSPITAL_BASED_OUTPATIENT_CLINIC_OR_DEPARTMENT_OTHER): Payer: PRIVATE HEALTH INSURANCE | Admitting: Internal Medicine

## 2013-05-29 ENCOUNTER — Telehealth: Payer: Self-pay | Admitting: Internal Medicine

## 2013-05-29 ENCOUNTER — Telehealth: Payer: Self-pay | Admitting: *Deleted

## 2013-05-29 ENCOUNTER — Encounter: Payer: Self-pay | Admitting: Internal Medicine

## 2013-05-29 VITALS — BP 100/58 | HR 74 | Temp 99.2°F | Resp 17 | Ht 68.0 in

## 2013-05-29 DIAGNOSIS — R5381 Other malaise: Secondary | ICD-10-CM

## 2013-05-29 DIAGNOSIS — R0609 Other forms of dyspnea: Secondary | ICD-10-CM

## 2013-05-29 DIAGNOSIS — C3410 Malignant neoplasm of upper lobe, unspecified bronchus or lung: Secondary | ICD-10-CM

## 2013-05-29 DIAGNOSIS — C3491 Malignant neoplasm of unspecified part of right bronchus or lung: Secondary | ICD-10-CM

## 2013-05-29 MED ORDER — CYANOCOBALAMIN 1000 MCG/ML IJ SOLN
1000.0000 ug | Freq: Once | INTRAMUSCULAR | Status: AC
  Administered 2013-05-29: 1000 ug via INTRAMUSCULAR

## 2013-05-29 NOTE — Telephone Encounter (Signed)
Per staff message and POF I have scheduled appts. Advised scheduler that lab appt needs to be moved JMW

## 2013-05-29 NOTE — Telephone Encounter (Signed)
Gave pt appt for lab and MD, emailed Marcelino Duster regardingn chemo

## 2013-05-29 NOTE — Telephone Encounter (Signed)
Called pt left appt for 06/05/13 lab and chemo

## 2013-05-29 NOTE — Patient Instructions (Signed)
CURRENT THERAPY: Systemic chemotherapy with carboplatin for AUC of 5 and Alimta 500 mg/M2 every 3 weeks. First dose expected on 06/05/2013.  CHEMOTHERAPY INTENT: Palliative  CURRENT # OF CHEMOTHERAPY CYCLES: 1  CURRENT ANTIEMETICS: Zofran, dexamethasone and Compazine  CURRENT SMOKING STATUS:  Former smoker  ORAL CHEMOTHERAPY AND CONSENT: None  CURRENT BISPHOSPHONATES USE: None  PAIN MANAGEMENT: 0/10  NARCOTICS INDUCED CONSTIPATION: None  LIVING WILL AND CODE STATUS: Full code

## 2013-05-29 NOTE — Progress Notes (Signed)
[facility] Telephone:([phone]) [phone]   [phone]  OFFICE PROGRESS NOTE  JOHNSON, ADRENA E, PA-C [address] [facility] [place] [address]  DIAGNOSIS: Metastatic non-small cell lung cancer initially diagnosed as stage IIIA (T1a, N2, M0) non-small cell lung cancer, adenocarcinoma diagnosed in December of 2013.   PRIOR THERAPY: concurrent chemoradiation with weekly carboplatin for AUC of 2 and paclitaxel 45 mg/M2, status post 4 weekly doses of chemotherapy last dose was given on 11/06/2012 and last fraction of radiotherapy 11/07/2012.   CURRENT THERAPY: Systemic chemotherapy with carboplatin for AUC of 5 and Alimta 500 mg/M2 every 3 weeks. First dose expected on 06/05/2013.  CHEMOTHERAPY INTENT: Palliative  CURRENT # OF CHEMOTHERAPY CYCLES: 1  CURRENT ANTIEMETICS: Zofran, dexamethasone and Compazine  CURRENT SMOKING STATUS:  Former smoker  ORAL CHEMOTHERAPY AND CONSENT: None  CURRENT BISPHOSPHONATES USE: None  PAIN MANAGEMENT: 0/10  NARCOTICS INDUCED CONSTIPATION: None  LIVING WILL AND CODE STATUS: Full code   INTERVAL HISTORY: [name] 67 y.o. male returns to the clinic today for followup visit accompanied by his caregiver from the skilled nursing facility. The patient is feeling fine today with no specific complaints except for the last fatigue and weakness. He doesn't do much at the skilled nursing facility rather than watching TV. He denied having any significant weight loss or night sweats. The patient denied having any nausea or vomiting. He has no chest pain but continues to have shortness breath with exertion with no cough or hemoptysis. He had a PET scan performed recently for evaluation of the questionable progressive disease in the lung and he is here for evaluation and discussion of his scan results.  MEDICAL HISTORY: Past Medical History  Diagnosis Date  . Prostate cancer 04/22/09   Adenomacarcinoma,glleason=#=#=6,volume=32.9cc,PSA=5.91  . Gout     hx  . Myocardial infarction   . Anxiety   . Depression   . PVD (peripheral vascular disease)   . Stroke     hx syndrome  . Seizures   . Hypertension   . Hypercholesterolemia   . History of radiation therapy 08/25/2009-10/28/2009    prostate 78 Gy/40 fractions  . Lung cancer 08/29/12    bx= right  upper outer lobe lung=Adenocarcinoma  . Anemia   . CAD (coronary artery disease)   . Gangrene     hx  . Hemiparesis     left , and contracture lue  . Sinus tachycardia     hx  . Seizure 10/03/2012  . CAD (coronary artery disease) 10/03/2012  . HTN (hypertension) 10/03/2012  . Stroke 10/03/2012  . Allergic rhinitis, cause unspecified 05/17/2013    ALLERGIES:  has No Known Allergies.  MEDICATIONS:  Current Outpatient Prescriptions  Medication Sig Dispense Refill  . acetaminophen (TYLENOL) 325 MG tablet Take 650 mg by mouth every 4 (four) hours as needed.      . Artificial Tear Ointment (AKWA TEARS) 0.5 % ophthalmic ointment Place into the right eye as needed.      Marland Kitchen aspirin 81 MG tablet Take 81 mg by mouth daily.      Marland Kitchen atorvastatin (LIPITOR) 20 MG tablet Take 20 mg by mouth daily.      . Cholecalciferol (VITAMIN D PO) Take by mouth daily. D 3-50 50000 units      . famciclovir (FAMVIR) 500 MG tablet Take 1 tablet (500 mg total) by mouth 3 (three) times daily.  21 tablet  0  . FLORA-Q (FLORA-Q) CAPS capsule  Take 1 capsule by mouth daily.      . Fluticasone-Salmeterol (ADVAIR) 100-50 MCG/DOSE AEPB Inhale 1 puff into the lungs every 12 (twelve) hours.      Marland Kitchen HYDROcodone-acetaminophen (NORCO/VICODIN) 5-325 MG per tablet Take 1 tablet by mouth every 4 (four) hours as needed.      Marland Kitchen ipratropium-albuterol (DUONEB) 0.5-2.5 (3) MG/3ML SOLN Take 3 mLs by nebulization every 6 (six) hours as needed.      . Lacosamide (VIMPAT) 100 MG TABS Take 1 tablet twice a day  60 tablet  5  . levETIRAcetam (KEPPRA) 1000 MG tablet Take 1,000 mg by  mouth 2 (two) times daily.      Marland Kitchen lidocaine-prilocaine (EMLA) cream Apply topically as needed. Apply to port approx 1 hour before chemo appt, cover with tegaderm.  30 g  0  . loratadine (CLARITIN) 10 MG tablet Take 10 mg by mouth daily.      . mirtazapine (REMERON SOL-TAB) 15 MG disintegrating tablet Take 15 mg by mouth at bedtime.      . Multiple Vitamin (MULTIVITAMIN WITH MINERALS) TABS Take 1 tablet by mouth daily.      . nitroGLYCERIN (NITROSTAT) 0.4 MG SL tablet Place 0.4 mg under the tongue every 5 (five) minutes as needed.      . phenytoin (DILANTIN) 100 MG ER capsule Take 100 mg by mouth 2 (two) times daily. Take 2 capsules bid      . pregabalin (LYRICA) 150 MG capsule Take 150 mg by mouth daily.      . prochlorperazine (COMPAZINE) 10 MG tablet Take 1 tablet (10 mg total) by mouth every 6 (six) hours as needed.  60 tablet  0  . sennosides-docusate sodium (SENOKOT-S) 8.6-50 MG tablet Take 1 tablet by mouth as directed.      . Vardenafil HCl (LEVITRA PO) Take by mouth.      . vitamin C (ASCORBIC ACID) 500 MG tablet Take 500 mg by mouth daily.       No current facility-administered medications for this visit.    SURGICAL HISTORY:  Past Surgical History  Procedure Laterality Date  . 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 Lung biopsy  08/29/12    RUO lung=Adenocarcinoma  . Prostate biopsy  04/22/09    Adenocarcinoma    REVIEW OF SYSTEMS:  A comprehensive review of systems was negative except for: Constitutional: positive for fatigue Respiratory: positive for cough and dyspnea on exertion Musculoskeletal: positive for muscle weakness   PHYSICAL EXAMINATION: General appearance: alert, cooperative, fatigued and no distress Head: Normocephalic, without obvious abnormality, atraumatic Neck: no adenopathy Lymph nodes: Cervical, supraclavicular, and axillary nodes normal. Resp: wheezes bilaterally Cardio: regular rate and rhythm, S1, S2 normal, no  murmur, click, rub or gallop GI: soft, non-tender; bowel sounds normal; no masses,  no organomegaly Extremities: Left AKA Neurologic: Alert and oriented X 3, normal strength and tone. Normal symmetric reflexes. Normal coordination and gait  ECOG PERFORMANCE STATUS: 2 - Symptomatic, <50% confined to bed  Blood pressure 100/58, pulse 74, temperature 99.2 F (37.3 C), temperature source Oral, resp. rate 17, height 5\' 8"  (1.727 m), weight 0 lb (0 kg), SpO2 99.00%.  LABORATORY DATA: Lab Results  Component Value Date   WBC 5.4 04/30/2013   HGB 14.8 04/30/2013   HCT 44.6 04/30/2013   MCV 97.1 04/30/2013   PLT 158 04/30/2013      Chemistry      Component Value Date/Time  NA 139 04/30/2013 0921   NA 140 03/10/2009 1220   K 4.0 04/30/2013 0921   K 4.2 03/10/2009 1220   CL 105 01/25/2013 0857   CL 109 03/10/2009 1220   CO2 25 04/30/2013 0921   CO2 26 03/10/2009 1220   BUN 10.1 04/30/2013 0921   BUN 13 03/10/2009 1220   CREATININE 0.8 04/30/2013 0921   CREATININE 0.88 03/10/2009 1220      Component Value Date/Time   CALCIUM 9.4 04/30/2013 0921   CALCIUM 8.8 03/10/2009 1220   ALKPHOS 107 04/30/2013 0921   ALKPHOS 57 10/25/2007 0545   AST 23 04/30/2013 0921   AST 97* 10/25/2007 0545   ALT 34 04/30/2013 0921   ALT 32 10/25/2007 0545   BILITOT 0.37 04/30/2013 0921   BILITOT 0.8 10/25/2007 0545       RADIOGRAPHIC STUDIES: Ct Chest W Contrast  04/30/2013   *RADIOLOGY REPORT*  Clinical Data: History of lung cancer status post chemotherapy and radiation therapy which is now complete.  Restaging scan.  CT CHEST WITH CONTRAST  Technique:  Multidetector CT imaging of the chest was performed following the standard protocol during bolus administration of intravenous contrast.  Contrast: 80mL OMNIPAQUE IOHEXOL 300 MG/ML  SOLN  Comparison: Chest CT 01/25/2013.  Findings:  Mediastinum: Right internal jugular single lumen Port-A-Cath with tip terminating in the right atrium. Heart size is normal. There is no significant pericardial  fluid, thickening or pericardial calcification. There is atherosclerosis of the thoracic aorta, the great vessels of the mediastinum and the coronary arteries, including calcified atherosclerotic plaque in the left main, left anterior descending, left circumflex and right coronary arteries. Status post median sternotomy for CABG, including a LIMA to the LAD.  Enlarged low right paratracheal lymph node appears slightly larger than the prior examination, currently measuring up to 2.7 x 2.3 cm (image 19 of series 2).  Prominent subcarinal nodal tissue is also noted measuring up to 12 mm in short axis.  Extensive peribronchovascular soft tissue is noted surrounding multiple bronchi extending to the right upper and lower lobes. Esophagus is unremarkable in appearance.  Lungs/Pleura: In the right upper lobe the previously noted right upper lobe pulmonary nodule appears less distinct and slightly less bulky than the prior examination, and is now it is surrounded by extensive air space consolidation (making accurate measurement difficult), septal thickening and architectural distortion, presumably postradiation changes.  These findings affect portions of both the posterior right upper lobe as well as the superior segment of the right lower lobe.  In the central aspect of the right lower lobe (image 30 of series 2) there is increasing nodular soft tissue thickening that currently measures 2.8 x 1.3 cm.  Small right pleural effusion layering dependently is simple in appearance and new compared to the prior examination.  Linear opacities in the left lower lobe are similar to the prior study, most compatible with areas of mild scarring.  Upper Abdomen: Unremarkable.  Musculoskeletal: Sternotomy wires. There are no aggressive appearing lytic or blastic lesions noted in the visualized portions of the skeleton.  IMPRESSION:  1.  Today's study demonstrates evolving postradiation changes in the upper right lung, with slight  decreased prominence of the previously noted right upper lobe nodule which is now largely obscured by the adjacent postradiation changes. 2.  However, there is an enlarging nodule in the central aspect of the right lower lobe which currently measures 1.3 x 2.8 cm, enlarging low right paratracheal lymphadenopathy and a prominent subcarinal lymph node, as  above, concerning for metastatic disease. 3.  New small right-sided pleural effusion is simple in appearance. 4. Atherosclerosis, including left main and three-vessel coronary artery disease.   Status post CABG.   Original Report Authenticated By: Trudie Reed, M.D.   Nm Pet Image Restag (ps) Skull Base To Thigh  05/11/2013   *RADIOLOGY REPORT*  Clinical Data: Subsequent treatment strategy for restaging of lung cancer.  NUCLEAR MEDICINE PET SKULL BASE TO THIGH  Fasting Blood Glucose:  88  Technique:  16.2 mCi F-18 FDG was injected intravenously. CT data was obtained and used for attenuation correction and anatomic localization only.  (This was not acquired as a diagnostic CT examination.) Additional exam technical data entered on technologist worksheet.  Comparison:  04/30/2013 chest CT.  Prior [facility] 08/21/2012 [facility].  Findings:  Neck: Development of a hypermetabolic left supraclavicular/lower jugular node.  This measures 1.0 cm and a S.U.V. max of 15.1 on image 53/series 2.  Chest:  Hypermetabolic right paratracheal adenopathy.  This measures 2.1 cm and a S.U.V. max of 13.1 on image 71/series 2.  On the prior PET, this had a maximum s u v of 11.4.  Hypermetabolic subcarinal and prevascular adenopathy as well.  Hypermetabolism corresponding to borderline enlarged left hilar nodal tissue.  This measures a S.U.V. max of 7.6 on image 86/series 2.  This is new since the prior PET.  As detailed on the prior diagnostic CT, right infrahilar nodal tissue corresponds to hypermetabolism.  This measures a S.U.V. max of 15.1 on image 87/series  2.  There is diffuse right upper lobe and adjacent superior segment right lower lobe hypermetabolism, corresponding to airspace disease and septal thickening.  Primarily felt to be radiation induced. More focal area of right upper lobe hypermetabolism corresponds to residual nodular component which measures 2.0 cm and a S.U.V. max of 11.5 on image 71/series 2.  Abdomen/Pelvis:  No abnormal hypermetabolism.  Skeleton:  Spinous process hypermetabolism in the lower lumbar spine is favored to be degenerative.  CT  images performed for attenuation correction demonstrate no further findings within the neck.  Chest findings deferred to recent diagnostic CT.  A small right- sided pleural effusion is not significantly changed.  Right nephrolithiasis.  Densely calcified inferior mesenteric artery.  Old granulomatous disease in the spleen.  Radiation seeds in the prostate.  Probable hydroceles.  Mild osteopenia.  Left iliac bone island.  IMPRESSION:  1.  Since 08/21/2012, progression of metastatic disease with nodal metastasis in the low neck, bilateral hila, and bilateral mediastinal stations. 2.  Radiation change throughout the right lung.  Suspect residual nodule centrally in the right upper lobe. 3.  Small right pleural effusion, similar to most recent CT.   Original Report Authenticated By: Jeronimo Greaves, M.D.    ASSESSMENT AND PLAN: This is a very pleasant 67 years old African American male with now metastatic non-small cell lung cancer, adenocarcinoma.  I discussed the PET scan results with the patient and his caregiver today. I recommended for him he is a palliative care and hospice referral versus consideration of systemic chemotherapy with carboplatin and Alimta. The patient is interested in proceeding with chemotherapy. I recommended for him regimen consisting of carboplatin for AUC of 5 and Alimta 500 mg/M2 given every 3 weeks. I discussed with the patient adverse effect of this treatment including but not  limited to alopecia, myelosuppression, nausea and vomiting, peripheral neuropathy, liver or renal dysfunction. The patient will receive vitamin B12 injection today. I would  also ask the skilled nursing facility to start him on treatment with folic acid 1 mg by mouth daily, Decadron 4 mg by mouth twice a day the day before, day of and day after the chemotherapy in addition to Compazine 10 mg by mouth every 6 hours as needed for nausea. The patient is expected to start the first cycle of this treatment next week. He would come back for followup visit in 2 weeks for evaluation and management any adverse effect of his treatment. He was advised to call immediately if he has any concerning symptoms in the interval.  The patient voices understanding of current disease status and treatment options and is in agreement with the current care plan.  All questions were answered. The patient knows to call the clinic with any problems, questions or concerns. We can certainly see the patient much sooner if necessary.  I spent 15 minutes counseling the patient face to face. The total time spent in the appointment was 25 minutes.

## 2013-06-04 ENCOUNTER — Other Ambulatory Visit: Payer: Self-pay | Admitting: *Deleted

## 2013-06-04 DIAGNOSIS — I10 Essential (primary) hypertension: Secondary | ICD-10-CM

## 2013-06-04 DIAGNOSIS — R569 Unspecified convulsions: Secondary | ICD-10-CM

## 2013-06-04 DIAGNOSIS — I2510 Atherosclerotic heart disease of native coronary artery without angina pectoris: Secondary | ICD-10-CM

## 2013-06-04 DIAGNOSIS — I639 Cerebral infarction, unspecified: Secondary | ICD-10-CM

## 2013-06-04 MED ORDER — HYDROCODONE-ACETAMINOPHEN 5-325 MG PO TABS: ORAL_TABLET | ORAL | Status: DC

## 2013-06-05 ENCOUNTER — Ambulatory Visit (HOSPITAL_BASED_OUTPATIENT_CLINIC_OR_DEPARTMENT_OTHER): Payer: PRIVATE HEALTH INSURANCE

## 2013-06-05 ENCOUNTER — Other Ambulatory Visit (HOSPITAL_BASED_OUTPATIENT_CLINIC_OR_DEPARTMENT_OTHER): Payer: PRIVATE HEALTH INSURANCE | Admitting: Lab

## 2013-06-05 ENCOUNTER — Other Ambulatory Visit: Payer: Self-pay | Admitting: Internal Medicine

## 2013-06-05 DIAGNOSIS — C3410 Malignant neoplasm of upper lobe, unspecified bronchus or lung: Secondary | ICD-10-CM

## 2013-06-05 DIAGNOSIS — Z5111 Encounter for antineoplastic chemotherapy: Secondary | ICD-10-CM

## 2013-06-05 DIAGNOSIS — C3491 Malignant neoplasm of unspecified part of right bronchus or lung: Secondary | ICD-10-CM

## 2013-06-05 LAB — CBC WITH DIFFERENTIAL/PLATELET
EOS%: 7.7 % — ABNORMAL HIGH (ref 0.0–7.0)
MCH: 31 pg (ref 27.2–33.4)
MCV: 94.8 fL (ref 79.3–98.0)
MONO%: 9.8 % (ref 0.0–14.0)
NEUT#: 2.5 10*3/uL (ref 1.5–6.5)
RBC: 4.77 10*6/uL (ref 4.20–5.82)
RDW: 13.7 % (ref 11.0–14.6)
lymph#: 0.9 10*3/uL (ref 0.9–3.3)
nRBC: 0 % (ref 0–0)

## 2013-06-05 LAB — COMPREHENSIVE METABOLIC PANEL (CC13)
ALT: 33 U/L (ref 0–55)
AST: 21 U/L (ref 5–34)
Alkaline Phosphatase: 99 U/L (ref 40–150)
Sodium: 140 mEq/L (ref 136–145)
Total Bilirubin: 0.27 mg/dL (ref 0.20–1.20)
Total Protein: 7.3 g/dL (ref 6.4–8.3)

## 2013-06-05 MED ORDER — SODIUM CHLORIDE 0.9 % IJ SOLN
10.0000 mL | INTRAMUSCULAR | Status: DC | PRN
  Administered 2013-06-05: 10 mL
  Filled 2013-06-05: qty 10

## 2013-06-05 MED ORDER — SODIUM CHLORIDE 0.9 % IV SOLN
500.0000 mg/m2 | Freq: Once | INTRAVENOUS | Status: AC
  Administered 2013-06-05: 875 mg via INTRAVENOUS
  Filled 2013-06-05: qty 35

## 2013-06-05 MED ORDER — ONDANSETRON 16 MG/50ML IVPB (CHCC)
16.0000 mg | Freq: Once | INTRAVENOUS | Status: AC
  Administered 2013-06-05: 16 mg via INTRAVENOUS

## 2013-06-05 MED ORDER — ONDANSETRON 16 MG/50ML IVPB (CHCC)
INTRAVENOUS | Status: AC
  Filled 2013-06-05: qty 16

## 2013-06-05 MED ORDER — DEXAMETHASONE SODIUM PHOSPHATE 20 MG/5ML IJ SOLN
20.0000 mg | Freq: Once | INTRAMUSCULAR | Status: AC
  Administered 2013-06-05: 20 mg via INTRAVENOUS

## 2013-06-05 MED ORDER — HEPARIN SOD (PORK) LOCK FLUSH 100 UNIT/ML IV SOLN
500.0000 [IU] | Freq: Once | INTRAVENOUS | Status: AC | PRN
  Administered 2013-06-05: 500 [IU]
  Filled 2013-06-05: qty 5

## 2013-06-05 MED ORDER — SODIUM CHLORIDE 0.9 % IV SOLN
Freq: Once | INTRAVENOUS | Status: AC
  Administered 2013-06-05: 11:00:00 via INTRAVENOUS

## 2013-06-05 MED ORDER — DEXAMETHASONE SODIUM PHOSPHATE 20 MG/5ML IJ SOLN
INTRAMUSCULAR | Status: AC
  Filled 2013-06-05: qty 5

## 2013-06-05 MED ORDER — SODIUM CHLORIDE 0.9 % IV SOLN
441.0000 mg | Freq: Once | INTRAVENOUS | Status: AC
  Administered 2013-06-05: 440 mg via INTRAVENOUS
  Filled 2013-06-05: qty 44

## 2013-06-05 NOTE — Patient Instructions (Addendum)
[facility] Discharge Instructions for Patients Receiving Chemotherapy  Today you received the following chemotherapy agents Alimta and Carboplatin.  To help prevent nausea and vomiting after your treatment, we encourage you to take your nausea medication.   If you develop nausea and vomiting that is not controlled by your nausea medication, call the clinic.   BELOW ARE SYMPTOMS THAT SHOULD BE REPORTED IMMEDIATELY:  *FEVER GREATER THAN 100.5 F  *CHILLS WITH OR WITHOUT FEVER  NAUSEA AND VOMITING THAT IS NOT CONTROLLED WITH YOUR NAUSEA MEDICATION  *UNUSUAL SHORTNESS OF BREATH  *UNUSUAL BRUISING OR BLEEDING  TENDERNESS IN MOUTH AND THROAT WITH OR WITHOUT PRESENCE OF ULCERS  *URINARY PROBLEMS  *BOWEL PROBLEMS  UNUSUAL RASH Items with * indicate a potential emergency and should be followed up as soon as possible.  Feel free to call the clinic you have any questions or concerns. The clinic phone number is [phone].   Pemetrexed injection What is this medicine? PEMETREXED (PEM e TREX ed) is a chemotherapy drug. This medicine affects cells that are rapidly growing, such as cancer cells and cells in your mouth and stomach. It is usually used to treat lung cancers like non-small cell lung cancer and mesothelioma. It may also be used to treat other cancers. This medicine may be used for other purposes; ask your health care provider or pharmacist if you have questions. What should I tell my health care provider before I take this medicine? They need to know if you have any of these conditions: -if you frequently drink alcohol containing beverages -infection (especially a virus infection such as chickenpox, cold sores, or herpes) -kidney disease -liver disease -low blood counts, like low platelets, red bloods, or white blood cells -an unusual or allergic reaction to pemetrexed, mannitol, other medicines, foods, dyes, or preservatives -pregnant or trying to get  pregnant -breast-feeding How should I use this medicine? This drug is given as an infusion into a vein. It is administered in a hospital or clinic by a specially trained health care professional. Talk to your pediatrician regarding the use of this medicine in children. Special care may be needed. Overdosage: If you think you have taken too much of this medicine contact a poison control center or emergency room at once. NOTE: This medicine is only for you. Do not share this medicine with others. What if I miss a dose? It is important not to miss your dose. Call your doctor or health care professional if you are unable to keep an appointment. What may interact with this medicine? -aspirin and aspirin-like medicines -medicines to increase blood counts like filgrastim, pegfilgrastim, sargramostim -methotrexate -NSAIDS, medicines for pain and inflammation, like ibuprofen or naproxen -probenecid -pyrimethamine -vaccines Talk to your doctor or health care professional before taking any of these medicines: -acetaminophen -aspirin -ibuprofen -ketoprofen -naproxen This list may not describe all possible interactions. Give your health care provider a list of all the medicines, herbs, non-prescription drugs, or dietary supplements you use. Also tell them if you smoke, drink alcohol, or use illegal drugs. Some items may interact with your medicine. What should I watch for while using this medicine? Visit your doctor for checks on your progress. This drug may make you feel generally unwell. This is not uncommon, as chemotherapy can affect healthy cells as well as cancer cells. Report any side effects. Continue your course of treatment even though you feel ill unless your doctor tells you to stop. In some cases, you may be given additional medicines  to help with side effects. Follow all directions for their use. Call your doctor or health care professional for advice if you get a fever, chills or sore  throat, or other symptoms of a cold or flu. Do not treat yourself. This drug decreases your body's ability to fight infections. Try to avoid being around people who are sick. This medicine may increase your risk to bruise or bleed. Call your doctor or health care professional if you notice any unusual bleeding. Be careful brushing and flossing your teeth or using a toothpick because you may get an infection or bleed more easily. If you have any dental work done, tell your dentist you are receiving this medicine. Avoid taking products that contain aspirin, acetaminophen, ibuprofen, naproxen, or ketoprofen unless instructed by your doctor. These medicines may hide a fever. Call your doctor or health care professional if you get diarrhea or mouth sores. Do not treat yourself. To protect your kidneys, drink water or other fluids as directed while you are taking this medicine. Men and women must use effective birth control while taking this medicine. You may also need to continue using effective birth control for a time after stopping this medicine. Do not become pregnant while taking this medicine. Tell your doctor right away if you think that you or your partner might be pregnant. There is a potential for serious side effects to an unborn child. Talk to your health care professional or pharmacist for more information. Do not breast-feed an infant while taking this medicine. This medicine may lower sperm counts. What side effects may I notice from receiving this medicine? Side effects that you should report to your doctor or health care professional as soon as possible: -allergic reactions like skin rash, itching or hives, swelling of the face, lips, or tongue -low blood counts - this medicine may decrease the number of white blood cells, red blood cells and platelets. You may be at increased risk for infections and bleeding. -signs of infection - fever or chills, cough, sore throat, pain or difficulty  passing urine -signs of decreased platelets or bleeding - bruising, pinpoint red spots on the skin, black, tarry stools, blood in the urine -signs of decreased red blood cells - unusually weak or tired, fainting spells, lightheadedness -breathing problems, like a dry cough -changes in emotions or moods -chest pain -confusion -diarrhea -high blood pressure -mouth or throat sores or ulcers -pain, swelling, warmth in the leg -pain on swallowing -swelling of the ankles, feet, hands -trouble passing urine or change in the amount of urine -vomiting -yellowing of the eyes or skin Side effects that usually do not require medical attention (report to your doctor or health care professional if they continue or are bothersome): -hair loss -loss of appetite -nausea -stomach upset This list may not describe all possible side effects. Call your doctor for medical advice about side effects. You may report side effects to FDA at 1-800-FDA-1088. Where should I keep my medicine? This drug is given in a hospital or clinic and will not be stored at home. NOTE: This sheet is a summary. It may not cover all possible information. If you have questions about this medicine, talk to your doctor, pharmacist, or health care provider.  2013, [employer]. (04/16/2008 1:24:03 PM)   Carboplatin injection What is this medicine? CARBOPLATIN (KAR boe pla tin) is a chemotherapy drug. It targets fast dividing cells, like cancer cells, and causes these cells to die. This medicine is used to treat ovarian cancer  and many other cancers. This medicine may be used for other purposes; ask your health care provider or pharmacist if you have questions. What should I tell my health care provider before I take this medicine? They need to know if you have any of these conditions: -blood disorders -hearing problems -kidney disease -recent or ongoing radiation therapy -an unusual or allergic reaction to carboplatin,  cisplatin, other chemotherapy, other medicines, foods, dyes, or preservatives -pregnant or trying to get pregnant -breast-feeding How should I use this medicine? This drug is usually given as an infusion into a vein. It is administered in a hospital or clinic by a specially trained health care professional. Talk to your pediatrician regarding the use of this medicine in children. Special care may be needed. Overdosage: If you think you have taken too much of this medicine contact a poison control center or emergency room at once. NOTE: This medicine is only for you. Do not share this medicine with others. What if I miss a dose? It is important not to miss a dose. Call your doctor or health care professional if you are unable to keep an appointment. What may interact with this medicine? -medicines for seizures -medicines to increase blood counts like filgrastim, pegfilgrastim, sargramostim -some antibiotics like amikacin, gentamicin, neomycin, streptomycin, tobramycin -vaccines Talk to your doctor or health care professional before taking any of these medicines: -acetaminophen -aspirin -ibuprofen -ketoprofen -naproxen This list may not describe all possible interactions. Give your health care provider a list of all the medicines, herbs, non-prescription drugs, or dietary supplements you use. Also tell them if you smoke, drink alcohol, or use illegal drugs. Some items may interact with your medicine. What should I watch for while using this medicine? Your condition will be monitored carefully while you are receiving this medicine. You will need important blood work done while you are taking this medicine. This drug may make you feel generally unwell. This is not uncommon, as chemotherapy can affect healthy cells as well as cancer cells. Report any side effects. Continue your course of treatment even though you feel ill unless your doctor tells you to stop. In some cases, you may be given  additional medicines to help with side effects. Follow all directions for their use. Call your doctor or health care professional for advice if you get a fever, chills or sore throat, or other symptoms of a cold or flu. Do not treat yourself. This drug decreases your body's ability to fight infections. Try to avoid being around people who are sick. This medicine may increase your risk to bruise or bleed. Call your doctor or health care professional if you notice any unusual bleeding. Be careful brushing and flossing your teeth or using a toothpick because you may get an infection or bleed more easily. If you have any dental work done, tell your dentist you are receiving this medicine. Avoid taking products that contain aspirin, acetaminophen, ibuprofen, naproxen, or ketoprofen unless instructed by your doctor. These medicines may hide a fever. Do not become pregnant while taking this medicine. Women should inform their doctor if they wish to become pregnant or think they might be pregnant. There is a potential for serious side effects to an unborn child. Talk to your health care professional or pharmacist for more information. Do not breast-feed an infant while taking this medicine. What side effects may I notice from receiving this medicine? Side effects that you should report to your doctor or health care professional as soon as possible: -  allergic reactions like skin rash, itching or hives, swelling of the face, lips, or tongue -signs of infection - fever or chills, cough, sore throat, pain or difficulty passing urine -signs of decreased platelets or bleeding - bruising, pinpoint red spots on the skin, black, tarry stools, nosebleeds -signs of decreased red blood cells - unusually weak or tired, fainting spells, lightheadedness -breathing problems -changes in hearing -changes in vision -chest pain -high blood pressure -low blood counts - This drug may decrease the number of white blood cells, red  blood cells and platelets. You may be at increased risk for infections and bleeding. -nausea and vomiting -pain, swelling, redness or irritation at the injection site -pain, tingling, numbness in the hands or feet -problems with balance, talking, walking -trouble passing urine or change in the amount of urine Side effects that usually do not require medical attention (report to your doctor or health care professional if they continue or are bothersome): -hair loss -loss of appetite -metallic taste in the mouth or changes in taste This list may not describe all possible side effects. Call your doctor for medical advice about side effects. You may report side effects to FDA at 1-800-FDA-1088. Where should I keep my medicine? This drug is given in a hospital or clinic and will not be stored at home. NOTE: This sheet is a summary. It may not cover all possible information. If you have questions about this medicine, talk to your doctor, pharmacist, or health care provider.  2012, [employer]. (12/19/2007 2:38:05 PM)

## 2013-06-07 ENCOUNTER — Telehealth: Payer: Self-pay | Admitting: *Deleted

## 2013-06-07 NOTE — Telephone Encounter (Signed)
Message copied by Augusto Garbe on Thu Jun 07, 2013  5:12 PM ------      Message from: Rexene Edison      Created: Tue Jun 05, 2013  1:58 PM      Regarding: 1st time alimta      Contact: [phone]       First time with Alimta. Dr Shirline Frees. ------

## 2013-06-07 NOTE — Telephone Encounter (Signed)
[facility], spoke with patient's nurse [facility] who says he is doing well.  Yesterday patient vomited.  Believes he did receive anti-emetic.  No emesis today.  [name] is doing well.  Denies any new side effects or symptoms.  Bowel and bladder functioning well.  Eating and drinking and I instructed nurse we want our patients to drink 64 oz minimum daily or at least the day before, of and after treatment.  Denies questions at this time and encouraged to call if needed.

## 2013-06-12 ENCOUNTER — Other Ambulatory Visit (HOSPITAL_BASED_OUTPATIENT_CLINIC_OR_DEPARTMENT_OTHER): Payer: PRIVATE HEALTH INSURANCE | Admitting: Lab

## 2013-06-12 ENCOUNTER — Telehealth: Payer: Self-pay | Admitting: Internal Medicine

## 2013-06-12 ENCOUNTER — Ambulatory Visit (HOSPITAL_BASED_OUTPATIENT_CLINIC_OR_DEPARTMENT_OTHER): Payer: PRIVATE HEALTH INSURANCE | Admitting: Physician Assistant

## 2013-06-12 ENCOUNTER — Ambulatory Visit: Payer: PRIVATE HEALTH INSURANCE

## 2013-06-12 DIAGNOSIS — I2510 Atherosclerotic heart disease of native coronary artery without angina pectoris: Secondary | ICD-10-CM

## 2013-06-12 DIAGNOSIS — C3490 Malignant neoplasm of unspecified part of unspecified bronchus or lung: Secondary | ICD-10-CM

## 2013-06-12 DIAGNOSIS — I10 Essential (primary) hypertension: Secondary | ICD-10-CM

## 2013-06-12 DIAGNOSIS — R569 Unspecified convulsions: Secondary | ICD-10-CM

## 2013-06-12 DIAGNOSIS — I6350 Cerebral infarction due to unspecified occlusion or stenosis of unspecified cerebral artery: Secondary | ICD-10-CM

## 2013-06-12 DIAGNOSIS — I639 Cerebral infarction, unspecified: Secondary | ICD-10-CM

## 2013-06-12 LAB — CBC WITH DIFFERENTIAL/PLATELET
BASO%: 3.2 % — ABNORMAL HIGH (ref 0.0–2.0)
Eosinophils Absolute: 0.1 10*3/uL (ref 0.0–0.5)
MCHC: 34.1 g/dL (ref 32.0–36.0)
MONO#: 0.1 10*3/uL (ref 0.1–0.9)
NEUT#: 0.5 10*3/uL — CL (ref 1.5–6.5)
RBC: 4.44 10*6/uL (ref 4.20–5.82)
RDW: 12.9 % (ref 11.0–14.6)
WBC: 1.3 10*3/uL — ABNORMAL LOW (ref 4.0–10.3)
lymph#: 0.5 10*3/uL — ABNORMAL LOW (ref 0.9–3.3)

## 2013-06-12 LAB — COMPREHENSIVE METABOLIC PANEL (CC13)
ALT: 30 U/L (ref 0–55)
Albumin: 2.9 g/dL — ABNORMAL LOW (ref 3.5–5.0)
CO2: 27 mEq/L (ref 22–29)
Calcium: 8.7 mg/dL (ref 8.4–10.4)
Chloride: 103 mEq/L (ref 98–109)
Glucose: 106 mg/dl (ref 70–140)
Potassium: 3.8 mEq/L (ref 3.5–5.1)
Sodium: 137 mEq/L (ref 136–145)
Total Bilirubin: 0.42 mg/dL (ref 0.20–1.20)
Total Protein: 7.9 g/dL (ref 6.4–8.3)

## 2013-06-12 MED ORDER — SODIUM CHLORIDE 0.9 % IJ SOLN
10.0000 mL | INTRAMUSCULAR | Status: DC | PRN
  Administered 2013-06-12: 10 mL via INTRAVENOUS
  Filled 2013-06-12: qty 10

## 2013-06-12 MED ORDER — HEPARIN SOD (PORK) LOCK FLUSH 100 UNIT/ML IV SOLN
500.0000 [IU] | Freq: Once | INTRAVENOUS | Status: AC
  Administered 2013-06-12: 500 [IU] via INTRAVENOUS
  Filled 2013-06-12: qty 5

## 2013-06-12 NOTE — Progress Notes (Addendum)
[facility] Telephone:(336) [phone]   Fax:(336) [phone]  OFFICE PROGRESS NOTE  [name] E, PA-C [address] [facility] [place] [address]  DIAGNOSIS: Metastatic non-small cell lung cancer initially diagnosed as stage IIIA (T1a, N2, M0) non-small cell lung cancer, adenocarcinoma diagnosed in December of 2013.   PRIOR THERAPY: concurrent chemoradiation with weekly carboplatin for AUC of 2 and paclitaxel 45 mg/M2, status post 4 weekly doses of chemotherapy last dose was given on 11/06/2012 and last fraction of radiotherapy 11/07/2012.   CURRENT THERAPY: Systemic chemotherapy with carboplatin for AUC of 5 and Alimta 500 mg/M2 every 3 weeks. First dose given on 06/05/2013.  CHEMOTHERAPY INTENT: Palliative  CURRENT # OF CHEMOTHERAPY CYCLES: 1  CURRENT ANTIEMETICS: Zofran, dexamethasone and Compazine  CURRENT SMOKING STATUS:  Former smoker  ORAL CHEMOTHERAPY AND CONSENT: None  CURRENT BISPHOSPHONATES USE: None  PAIN MANAGEMENT: 0/10  NARCOTICS INDUCED CONSTIPATION: None  LIVING WILL AND CODE STATUS: Full code   INTERVAL HISTORY: [name] 67 y.o. male returns to the clinic today for a symptom management visit accompanied by his caregiver from the skilled nursing facility. The patient is feeling fine today with no specific complaints except for the last fatigue and weakness. He doesn't do much at the skilled nursing facility rather than watching TV. He denied having any significant weight loss or night sweats. He did have about 3 or 4 episodes of vomiting related to his first cycle of chemotherapy. Is not clear whether he was given any of his Compazine. Also do not see folic acid listed although he is supposed to be on this medication. He otherwise tolerated the first cycle of chemotherapy without difficulty.  He has no chest pain but continues to have shortness breath with exertion with no cough or hemoptysis.   MEDICAL  HISTORY: Past Medical History  Diagnosis Date  . Prostate cancer 04/22/09    Adenomacarcinoma,glleason=#=#=6,volume=32.9cc,PSA=5.91  . Gout     hx  . Myocardial infarction   . Anxiety   . Depression   . PVD (peripheral vascular disease)   . Stroke     hx syndrome  . Seizures   . Hypertension   . Hypercholesterolemia   . History of radiation therapy 08/25/2009-10/28/2009    prostate 78 Gy/40 fractions  . Lung cancer 08/29/12    bx= right  upper outer lobe lung=Adenocarcinoma  . Anemia   . CAD (coronary artery disease)   . Gangrene     hx  . Hemiparesis     left , and contracture lue  . Sinus tachycardia     hx  . Seizure 10/03/2012  . CAD (coronary artery disease) 10/03/2012  . HTN (hypertension) 10/03/2012  . Stroke 10/03/2012  . Allergic rhinitis, cause unspecified 05/17/2013    ALLERGIES:  has No Known Allergies.  MEDICATIONS:  Current Outpatient Prescriptions  Medication Sig Dispense Refill  . acetaminophen (TYLENOL) 325 MG tablet Take 650 mg by mouth every 4 (four) hours as needed.      . Artificial Tear Ointment (AKWA TEARS) 0.5 % ophthalmic ointment Place into the right eye as needed.      Marland Kitchen aspirin 81 MG tablet Take 81 mg by mouth daily.      Marland Kitchen atorvastatin (LIPITOR) 20 MG tablet Take 20 mg by mouth daily.      . Cholecalciferol (VITAMIN D PO) Take by mouth daily. D 3-50 50000 units      . famciclovir (FAMVIR) 500 MG tablet  Take 1 tablet (500 mg total) by mouth 3 (three) times daily.  21 tablet  0  . feeding supplement (RESOURCE BREEZE) LIQD Take 1 Container by mouth at bedtime.      Marland Kitchen FLORA-Q (FLORA-Q) CAPS capsule Take 1 capsule by mouth daily.      . Fluticasone-Salmeterol (ADVAIR) 100-50 MCG/DOSE AEPB Inhale 1 puff into the lungs every 12 (twelve) hours.      . folic acid (FOLVITE) 1 MG tablet Take 1 mg by mouth daily.      . hyaluronate sodium (RADIAPLEXRX) GEL Apply topically once.      Marland Kitchen HYDROcodone-acetaminophen (NORCO/VICODIN) 5-325 MG per tablet Take one tablet  by mouth every 4 hours as needed for pain or headache  180 tablet  0  . ipratropium-albuterol (DUONEB) 0.5-2.5 (3) MG/3ML SOLN Take 3 mLs by nebulization every 6 (six) hours as needed.      . Lacosamide (VIMPAT) 100 MG TABS Take 1 tablet twice a day  60 tablet  5  . levETIRAcetam (KEPPRA) 1000 MG tablet Take 1,000 mg by mouth 2 (two) times daily.      Marland Kitchen lidocaine-prilocaine (EMLA) cream Apply topically as needed. Apply to port approx 1 hour before chemo appt, cover with tegaderm.  30 g  0  . loratadine (CLARITIN) 10 MG tablet Take 10 mg by mouth daily.      . mirtazapine (REMERON SOL-TAB) 15 MG disintegrating tablet Take 15 mg by mouth at bedtime.      . Multiple Vitamin (MULTIVITAMIN WITH MINERALS) TABS Take 1 tablet by mouth daily.      . nitroGLYCERIN (NITROSTAT) 0.4 MG SL tablet Place 0.4 mg under the tongue every 5 (five) minutes as needed.      . phenytoin (DILANTIN) 100 MG ER capsule Take 100 mg by mouth 2 (two) times daily. Take 2 capsules bid      . pregabalin (LYRICA) 150 MG capsule Take 150 mg by mouth daily.      . prochlorperazine (COMPAZINE) 10 MG tablet Take 1 tablet (10 mg total) by mouth every 6 (six) hours as needed.  60 tablet  0  . sennosides-docusate sodium (SENOKOT-S) 8.6-50 MG tablet Take 1 tablet by mouth as directed.      . Vardenafil HCl (LEVITRA PO) Take by mouth.      . vitamin C (ASCORBIC ACID) 500 MG tablet Take 500 mg by mouth daily.       No current facility-administered medications for this visit.   Facility-Administered Medications Ordered in Other Visits  Medication Dose Route Frequency Provider Last Rate Last Dose  . sodium chloride 0.9 % injection 10 mL  10 mL Intravenous PRN Si Gaul, MD   10 mL at 06/12/13 1245    SURGICAL HISTORY:  Past Surgical History  Procedure Laterality Date  . 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 Lung biopsy  08/29/12    RUO lung=Adenocarcinoma  . Prostate biopsy  04/22/09     Adenocarcinoma    REVIEW OF SYSTEMS:  A comprehensive review of systems was negative except for: Constitutional: positive for fatigue Respiratory: positive for dyspnea on exertion Gastrointestinal: positive for vomiting Musculoskeletal: positive for muscle weakness   PHYSICAL EXAMINATION: General appearance: alert, cooperative, fatigued and no distress Head: Normocephalic, without obvious abnormality, atraumatic Neck: no adenopathy Lymph nodes: Cervical, supraclavicular, and axillary nodes normal. Resp: wheezes bilaterally Cardio: regular rate and rhythm, S1, S2 normal, no murmur, click, rub or gallop  GI: soft, non-tender; bowel sounds normal; no masses,  no organomegaly Extremities: Left AKA Neurologic: Alert and oriented X 3, normal strength and tone. Normal symmetric reflexes. Normal coordination and gait  ECOG PERFORMANCE STATUS: 2 - Symptomatic, <50% confined to bed  Blood pressure 100/66, pulse 89, temperature 97.7 F (36.5 C), temperature source Oral, resp. rate 20.  LABORATORY DATA: Lab Results  Component Value Date   WBC 1.3* 06/12/2013   HGB 14.0 06/12/2013   HCT 41.0 06/12/2013   MCV 92.3 06/12/2013   PLT 72* 06/12/2013      Chemistry      Component Value Date/Time   NA 137 06/12/2013 1025   NA 140 03/10/2009 1220   K 3.8 06/12/2013 1025   K 4.2 03/10/2009 1220   CL 105 01/25/2013 0857   CL 109 03/10/2009 1220   CO2 27 06/12/2013 1025   CO2 26 03/10/2009 1220   BUN 14.8 06/12/2013 1025   BUN 13 03/10/2009 1220   CREATININE 0.7 06/12/2013 1025   CREATININE 0.88 03/10/2009 1220      Component Value Date/Time   CALCIUM 8.7 06/12/2013 1025   CALCIUM 8.8 03/10/2009 1220   ALKPHOS 111 06/12/2013 1025   ALKPHOS 57 10/25/2007 0545   AST 27 06/12/2013 1025   AST 97* 10/25/2007 0545   ALT 30 06/12/2013 1025   ALT 32 10/25/2007 0545   BILITOT 0.42 06/12/2013 1025   BILITOT 0.8 10/25/2007 0545       RADIOGRAPHIC STUDIES: Ct Chest W Contrast  04/30/2013   *RADIOLOGY REPORT*   Clinical Data: History of lung cancer status post chemotherapy and radiation therapy which is now complete.  Restaging scan.  CT CHEST WITH CONTRAST  Technique:  Multidetector CT imaging of the chest was performed following the standard protocol during bolus administration of intravenous contrast.  Contrast: 80mL OMNIPAQUE IOHEXOL 300 MG/ML  SOLN  Comparison: Chest CT 01/25/2013.  Findings:  Mediastinum: Right internal jugular single lumen Port-A-Cath with tip terminating in the right atrium. Heart size is normal. There is no significant pericardial fluid, thickening or pericardial calcification. There is atherosclerosis of the thoracic aorta, the great vessels of the mediastinum and the coronary arteries, including calcified atherosclerotic plaque in the left main, left anterior descending, left circumflex and right coronary arteries. Status post median sternotomy for CABG, including a LIMA to the LAD.  Enlarged low right paratracheal lymph node appears slightly larger than the prior examination, currently measuring up to 2.7 x 2.3 cm (image 19 of series 2).  Prominent subcarinal nodal tissue is also noted measuring up to 12 mm in short axis.  Extensive peribronchovascular soft tissue is noted surrounding multiple bronchi extending to the right upper and lower lobes. Esophagus is unremarkable in appearance.  Lungs/Pleura: In the right upper lobe the previously noted right upper lobe pulmonary nodule appears less distinct and slightly less bulky than the prior examination, and is now it is surrounded by extensive air space consolidation (making accurate measurement difficult), septal thickening and architectural distortion, presumably postradiation changes.  These findings affect portions of both the posterior right upper lobe as well as the superior segment of the right lower lobe.  In the central aspect of the right lower lobe (image 30 of series 2) there is increasing nodular soft tissue thickening that currently  measures 2.8 x 1.3 cm.  Small right pleural effusion layering dependently is simple in appearance and new compared to the prior examination.  Linear opacities in the left lower lobe are similar to  the prior study, most compatible with areas of mild scarring.  Upper Abdomen: Unremarkable.  Musculoskeletal: Sternotomy wires. There are no aggressive appearing lytic or blastic lesions noted in the visualized portions of the skeleton.  IMPRESSION:  1.  Today's study demonstrates evolving postradiation changes in the upper right lung, with slight decreased prominence of the previously noted right upper lobe nodule which is now largely obscured by the adjacent postradiation changes. 2.  However, there is an enlarging nodule in the central aspect of the right lower lobe which currently measures 1.3 x 2.8 cm, enlarging low right paratracheal lymphadenopathy and a prominent subcarinal lymph node, as above, concerning for metastatic disease. 3.  New small right-sided pleural effusion is simple in appearance. 4. Atherosclerosis, including left main and three-vessel coronary artery disease.   Status post CABG.   Original Report Authenticated By: Trudie Reed, M.D.   Nm Pet Image Restag (ps) Skull Base To Thigh  05/11/2013   *RADIOLOGY REPORT*  Clinical Data: Subsequent treatment strategy for restaging of lung cancer.  NUCLEAR MEDICINE PET SKULL BASE TO THIGH  Fasting Blood Glucose:  88  Technique:  16.2 mCi F-18 FDG was injected intravenously. CT data was obtained and used for attenuation correction and anatomic localization only.  (This was not acquired as a diagnostic CT examination.) Additional exam technical data entered on technologist worksheet.  Comparison:  04/30/2013 chest CT.  Prior PET of 08/21/2012 from [facility].  Findings:  Neck: Development of a hypermetabolic left supraclavicular/lower jugular node.  This measures 1.0 cm and a S.U.V. max of 15.1 on image 53/series 2.  Chest:   Hypermetabolic right paratracheal adenopathy.  This measures 2.1 cm and a S.U.V. max of 13.1 on image 71/series 2.  On the prior PET, this had a maximum s u v of 11.4.  Hypermetabolic subcarinal and prevascular adenopathy as well.  Hypermetabolism corresponding to borderline enlarged left hilar nodal tissue.  This measures a S.U.V. max of 7.6 on image 86/series 2.  This is new since the prior PET.  As detailed on the prior diagnostic CT, right infrahilar nodal tissue corresponds to hypermetabolism.  This measures a S.U.V. max of 15.1 on image 87/series 2.  There is diffuse right upper lobe and adjacent superior segment right lower lobe hypermetabolism, corresponding to airspace disease and septal thickening.  Primarily felt to be radiation induced. More focal area of right upper lobe hypermetabolism corresponds to residual nodular component which measures 2.0 cm and a S.U.V. max of 11.5 on image 71/series 2.  Abdomen/Pelvis:  No abnormal hypermetabolism.  Skeleton:  Spinous process hypermetabolism in the lower lumbar spine is favored to be degenerative.  CT  images performed for attenuation correction demonstrate no further findings within the neck.  Chest findings deferred to recent diagnostic CT.  A small right- sided pleural effusion is not significantly changed.  Right nephrolithiasis.  Densely calcified inferior mesenteric artery.  Old granulomatous disease in the spleen.  Radiation seeds in the prostate.  Probable hydroceles.  Mild osteopenia.  Left iliac bone island.  IMPRESSION:  1.  Since 08/21/2012, progression of metastatic disease with nodal metastasis in the low neck, bilateral hila, and bilateral mediastinal stations. 2.  Radiation change throughout the right lung.  Suspect residual nodule centrally in the right upper lobe. 3.  Small right pleural effusion, similar to most recent CT.   Original Report Authenticated By: Jeronimo Greaves, M.D.    ASSESSMENT AND PLAN: This is a very pleasant 67 years old  African  American male with now metastatic non-small cell lung cancer, adenocarcinoma. Is currently being treated with systemic chemotherapy in the form of carboplatin for an AUC of 5 and Alimta at 500 mg region squared given every 3 weeks, status post 1 cycle he is neutropenic today with a total white count 1.3 and absolute neutrophil count of 0.5. Given his level of neutropenia and the fact that he is in an assisted living facility we will cover him with a 5 day course of ciprofloxacin at 500 mg twice daily for 5 days. Additionally we recommend that he take folic acid 1 mg by mouth daily and Compazine 10 mg by mouth every 6 hours as needed for nausea. Neutropenic precautions were reviewed He is to continue with weekly labs as scheduled and return in 2 weeks prior to the start of cycle #2 of his systemic chemotherapy with carboplatin and Alimta.  Laural Benes, Karlie Aung E, PA-C   He was advised to call immediately if he has any concerning symptoms in the interval.  The patient voices understanding of current disease status and treatment options and is in agreement with the current care plan.  All questions were answered. The patient knows to call the clinic with any problems, questions or concerns. We can certainly see the patient much sooner if necessary.  ADDENDUM:  Hematology/Oncology Attending:  I have a face to face encounter with the patient during his visit. I recommended his care plan. The patient has metastatic non-small cell lung cancer currently undergoing systemic chemotherapy with carboplatin and Alimta status post 1 cycle. He tolerated the first cycle of his treatment fairly well except for mild nausea results with Compazine. His absolute neutrophil count is low today and because the patient lives in the skilled nursing facility I decide to cover him with empiric antibiotic in the form of Cipro 500 mg by mouth twice a day for 5 days. We will continue to monitor him closely with weekly CBC and  comprehensive metabolic panel. The patient would come back for followup visit in 2 weeks with the start of cycle #2 of his chemotherapy. Lajuana Matte., MD 06/17/2013

## 2013-06-12 NOTE — Telephone Encounter (Signed)
, °

## 2013-06-12 NOTE — Patient Instructions (Addendum)
Take ciprofloxacin 500 mg by mouth twice daily for the next 5 days this is to cover you for your low white count related to your recent chemotherapy. You are to take folic acid 1 mg by mouth daily You may take Compazine 10 mg by mouth every 6 hours as needed for nausea and or vomiting  Continue with weekly labs here at the cancer Center as scheduled Followup in 2 weeks prior to the start of your next scheduled cycle of chemotherapy

## 2013-06-13 ENCOUNTER — Telehealth: Payer: Self-pay | Admitting: Medical Oncology

## 2013-06-13 NOTE — Telephone Encounter (Signed)
RN at [facility] asking about reason for cipro-explained reason to nurse. She stated he is already taking folic acid and compazine.

## 2013-06-18 ENCOUNTER — Non-Acute Institutional Stay (SKILLED_NURSING_FACILITY): Payer: PRIVATE HEALTH INSURANCE | Admitting: Internal Medicine

## 2013-06-18 DIAGNOSIS — R569 Unspecified convulsions: Secondary | ICD-10-CM

## 2013-06-18 DIAGNOSIS — C3410 Malignant neoplasm of upper lobe, unspecified bronchus or lung: Secondary | ICD-10-CM

## 2013-06-18 DIAGNOSIS — G609 Hereditary and idiopathic neuropathy, unspecified: Secondary | ICD-10-CM

## 2013-06-18 DIAGNOSIS — E7800 Pure hypercholesterolemia, unspecified: Secondary | ICD-10-CM

## 2013-06-18 NOTE — Progress Notes (Signed)
Patient ID: [name], male   DOB: 10-23-1945, 67 y.o.   MRN: [id number]        PROGRESS NOTE  DATE: 06/18/2013  FACILITY: Nursing Home Location: [facility]  LEVEL OF CARE: SNF (31)  Routine Visit  CHIEF COMPLAINT:  Manage seizure disorder, hyperlipidemia, and peripheral neuropathy.     HISTORY OF PRESENT ILLNESS:  REASSESSMENT OF ONGOING PROBLEM(S):  SEIZURE DISORDER: The patient's seizure disorder remains stable. No complications reported from the medications presently being used. Staff do not report any recent seizure activity.    HYPERLIPIDEMIA: No complications from the medications presently being used. Last fasting lipid panel showed :  In 01/2013:  Fasting lipid panel normal.    PERIPHERAL NEUROPATHY: The peripheral neuropathy is stable. The staff denies pain in the feet, tingling, and numbness. No complications noted from the medication presently being used.  PAST MEDICAL HISTORY : Reviewed.  No changes.  CURRENT MEDICATIONS: Reviewed per [facility]  REVIEW OF SYSTEMS:  Difficult to obtain.  Patient is a poor historian.    PHYSICAL EXAMINATION  VS:  T 98.6     P 70     RR 18    BP 110/60    POX %     WT (Lb) 131  GENERAL: no acute distress, normal body habitus EYES: conjunctivae normal, sclerae normal, normal eye lids NECK: supple, trachea midline, no neck masses, no thyroid tenderness, no thyromegaly LYMPHATICS: no LAN in the neck, no supraclavicular LAN RESPIRATORY: breathing is even & unlabored, BS CTAB CARDIAC: RRR, no murmur,no extra heart sounds, no edema GI: abdomen soft, normal BS, no masses, no tenderness, no hepatomegaly, no splenomegaly PSYCHIATRIC: the patient is alert & oriented to person, affect & behavior appropriate  LABS/RADIOLOGY: 8-14 Dilantin 6.7, albumin 3.6  01/2013:  Albumin 3, alkaline phosphatase 153, ALT 70, otherwise liver profile normal.    WBC 11, otherwise CBC normal.    Hemoglobin A1c 5.5.    BMP normal.     12/2012:  TSH 2.23.    11/2012:  Dilantin level 11.3.   ASSESSMENT/PLAN:  Seizure disorder.  Well controlled.    Hyperlipidemia.  Well controlled.    Peripheral neuropathy.  No complaints.    Allergic rhinitis.  Continue current medications.     Depression.  Continue Remeron.    Constipation.  Well controlled.    Lung cancer-on chemotherapy per her oncologist  CPT CODE: 40981

## 2013-06-19 ENCOUNTER — Other Ambulatory Visit (HOSPITAL_BASED_OUTPATIENT_CLINIC_OR_DEPARTMENT_OTHER): Payer: PRIVATE HEALTH INSURANCE | Admitting: Lab

## 2013-06-19 DIAGNOSIS — C3490 Malignant neoplasm of unspecified part of unspecified bronchus or lung: Secondary | ICD-10-CM

## 2013-06-19 DIAGNOSIS — I10 Essential (primary) hypertension: Secondary | ICD-10-CM

## 2013-06-19 DIAGNOSIS — I639 Cerebral infarction, unspecified: Secondary | ICD-10-CM

## 2013-06-19 DIAGNOSIS — I6350 Cerebral infarction due to unspecified occlusion or stenosis of unspecified cerebral artery: Secondary | ICD-10-CM

## 2013-06-19 DIAGNOSIS — R569 Unspecified convulsions: Secondary | ICD-10-CM

## 2013-06-19 DIAGNOSIS — I2510 Atherosclerotic heart disease of native coronary artery without angina pectoris: Secondary | ICD-10-CM

## 2013-06-19 LAB — COMPREHENSIVE METABOLIC PANEL (CC13)
AST: 25 U/L (ref 5–34)
Albumin: 3 g/dL — ABNORMAL LOW (ref 3.5–5.0)
Alkaline Phosphatase: 104 U/L (ref 40–150)
Potassium: 3.5 mEq/L (ref 3.5–5.1)
Sodium: 141 mEq/L (ref 136–145)
Total Protein: 8.4 g/dL — ABNORMAL HIGH (ref 6.4–8.3)

## 2013-06-19 LAB — CBC WITH DIFFERENTIAL/PLATELET
BASO%: 0.2 % (ref 0.0–2.0)
EOS%: 2.1 % (ref 0.0–7.0)
MCH: 31.5 pg (ref 27.2–33.4)
MCV: 94.8 fL (ref 79.3–98.0)
MONO%: 15.5 % — ABNORMAL HIGH (ref 0.0–14.0)
NEUT#: 1 10*3/uL — ABNORMAL LOW (ref 1.5–6.5)
RBC: 4.19 10*6/uL — ABNORMAL LOW (ref 4.20–5.82)
RDW: 12.8 % (ref 11.0–14.6)

## 2013-06-26 ENCOUNTER — Ambulatory Visit (HOSPITAL_BASED_OUTPATIENT_CLINIC_OR_DEPARTMENT_OTHER): Payer: PRIVATE HEALTH INSURANCE

## 2013-06-26 ENCOUNTER — Other Ambulatory Visit: Payer: PRIVATE HEALTH INSURANCE

## 2013-06-26 ENCOUNTER — Encounter: Payer: Self-pay | Admitting: Physician Assistant

## 2013-06-26 ENCOUNTER — Telehealth: Payer: Self-pay | Admitting: Internal Medicine

## 2013-06-26 ENCOUNTER — Ambulatory Visit (HOSPITAL_BASED_OUTPATIENT_CLINIC_OR_DEPARTMENT_OTHER): Payer: PRIVATE HEALTH INSURANCE | Admitting: Physician Assistant

## 2013-06-26 ENCOUNTER — Other Ambulatory Visit (HOSPITAL_BASED_OUTPATIENT_CLINIC_OR_DEPARTMENT_OTHER): Payer: PRIVATE HEALTH INSURANCE | Admitting: Lab

## 2013-06-26 ENCOUNTER — Telehealth: Payer: Self-pay | Admitting: *Deleted

## 2013-06-26 DIAGNOSIS — C3410 Malignant neoplasm of upper lobe, unspecified bronchus or lung: Secondary | ICD-10-CM

## 2013-06-26 DIAGNOSIS — D701 Agranulocytosis secondary to cancer chemotherapy: Secondary | ICD-10-CM

## 2013-06-26 DIAGNOSIS — I2510 Atherosclerotic heart disease of native coronary artery without angina pectoris: Secondary | ICD-10-CM

## 2013-06-26 DIAGNOSIS — C3490 Malignant neoplasm of unspecified part of unspecified bronchus or lung: Secondary | ICD-10-CM

## 2013-06-26 DIAGNOSIS — D702 Other drug-induced agranulocytosis: Secondary | ICD-10-CM

## 2013-06-26 DIAGNOSIS — R569 Unspecified convulsions: Secondary | ICD-10-CM

## 2013-06-26 DIAGNOSIS — I639 Cerebral infarction, unspecified: Secondary | ICD-10-CM

## 2013-06-26 DIAGNOSIS — Z5111 Encounter for antineoplastic chemotherapy: Secondary | ICD-10-CM

## 2013-06-26 DIAGNOSIS — I10 Essential (primary) hypertension: Secondary | ICD-10-CM

## 2013-06-26 LAB — CBC WITH DIFFERENTIAL/PLATELET
Basophils Absolute: 0 10*3/uL (ref 0.0–0.1)
EOS%: 1.9 % (ref 0.0–7.0)
MCH: 31.3 pg (ref 27.2–33.4)
MCV: 93.5 fL (ref 79.3–98.0)
MONO%: 21.1 % — ABNORMAL HIGH (ref 0.0–14.0)
RBC: 4.02 10*6/uL — ABNORMAL LOW (ref 4.20–5.82)
RDW: 14 % (ref 11.0–14.6)

## 2013-06-26 LAB — COMPREHENSIVE METABOLIC PANEL (CC13)
Alkaline Phosphatase: 97 U/L (ref 40–150)
Creatinine: 0.8 mg/dL (ref 0.7–1.3)
Glucose: 126 mg/dl (ref 70–140)
Sodium: 140 mEq/L (ref 136–145)
Total Bilirubin: <0.2 mg/dL (ref 0.20–1.20)
Total Protein: 7.2 g/dL (ref 6.4–8.3)

## 2013-06-26 MED ORDER — ONDANSETRON 16 MG/50ML IVPB (CHCC)
16.0000 mg | Freq: Once | INTRAVENOUS | Status: AC
  Administered 2013-06-26: 16 mg via INTRAVENOUS

## 2013-06-26 MED ORDER — SODIUM CHLORIDE 0.9 % IV SOLN
352.8000 mg | Freq: Once | INTRAVENOUS | Status: AC
  Administered 2013-06-26: 350 mg via INTRAVENOUS
  Filled 2013-06-26: qty 35

## 2013-06-26 MED ORDER — SODIUM CHLORIDE 0.9 % IV SOLN
Freq: Once | INTRAVENOUS | Status: AC
  Administered 2013-06-26: 11:00:00 via INTRAVENOUS

## 2013-06-26 MED ORDER — HEPARIN SOD (PORK) LOCK FLUSH 100 UNIT/ML IV SOLN
500.0000 [IU] | Freq: Once | INTRAVENOUS | Status: AC | PRN
  Administered 2013-06-26: 500 [IU]
  Filled 2013-06-26: qty 5

## 2013-06-26 MED ORDER — SODIUM CHLORIDE 0.9 % IJ SOLN
10.0000 mL | INTRAMUSCULAR | Status: DC | PRN
  Administered 2013-06-26: 10 mL
  Filled 2013-06-26: qty 10

## 2013-06-26 MED ORDER — ONDANSETRON 16 MG/50ML IVPB (CHCC)
INTRAVENOUS | Status: AC
  Filled 2013-06-26: qty 16

## 2013-06-26 MED ORDER — DEXAMETHASONE SODIUM PHOSPHATE 20 MG/5ML IJ SOLN
20.0000 mg | Freq: Once | INTRAMUSCULAR | Status: AC
  Administered 2013-06-26: 20 mg via INTRAVENOUS

## 2013-06-26 MED ORDER — SODIUM CHLORIDE 0.9 % IV SOLN
400.0000 mg/m2 | Freq: Once | INTRAVENOUS | Status: AC
  Administered 2013-06-26: 700 mg via INTRAVENOUS
  Filled 2013-06-26: qty 28

## 2013-06-26 MED ORDER — DEXAMETHASONE SODIUM PHOSPHATE 20 MG/5ML IJ SOLN
INTRAMUSCULAR | Status: AC
  Filled 2013-06-26: qty 5

## 2013-06-26 MED ORDER — PEGFILGRASTIM INJECTION 6 MG/0.6ML: 6.0000 mg | Freq: Once | SUBCUTANEOUS | Status: DC

## 2013-06-26 NOTE — Progress Notes (Addendum)
[facility] [phone]   [phone]  SHARED VISIT PROGRESS NOTE  [name] E, PA-C [address] [facility] [place] [address]  DIAGNOSIS: Metastatic non-small cell lung cancer initially diagnosed as stage IIIA (T1a, N2, M0) non-small cell lung cancer, adenocarcinoma diagnosed in December of 2013.   PRIOR THERAPY: concurrent chemoradiation with weekly carboplatin for AUC of 2 and paclitaxel 45 mg/M2, status post 4 weekly doses of chemotherapy last dose was given on 11/06/2012 and last fraction of radiotherapy 11/07/2012.   CURRENT THERAPY: Systemic chemotherapy with carboplatin for AUC of 5 and Alimta 500 mg/M2 every 3 weeks. First dose given on 06/05/2013. Status post 1 cycle  CHEMOTHERAPY INTENT: Palliative  CURRENT # OF CHEMOTHERAPY CYCLES: 1  CURRENT ANTIEMETICS: Zofran, dexamethasone and Compazine  CURRENT SMOKING STATUS:  Former smoker  ORAL CHEMOTHERAPY AND CONSENT: None  CURRENT BISPHOSPHONATES USE: None  PAIN MANAGEMENT: 0/10  NARCOTICS INDUCED CONSTIPATION: None  LIVING WILL AND CODE STATUS: Full code   INTERVAL HISTORY: [name] 67 y.o. male returns to the clinic today for a symptom management visit accompanied by his caregiver from the skilled nursing facility. The patient is feeling fine today with no specific complaints except for right sided chest discomfort.  He denied having any significant weight loss or night sweats. Overall he tolerated the first cycle of chemotherapy without difficulty.  He continues to have shortness breath with exertion with no cough or hemoptysis. He presents today to proceed with cycle #2 of his systemic chemotherapy with carboplatin and Alimta.  MEDICAL HISTORY: Past Medical History  Diagnosis Date  . Prostate cancer 04/22/09    Adenomacarcinoma,glleason=#=#=6,volume=32.9cc,PSA=5.91  . Gout     hx  . Myocardial infarction   . Anxiety   . Depression   .  PVD (peripheral vascular disease)   . Stroke     hx syndrome  . Seizures   . Hypertension   . Hypercholesterolemia   . History of radiation therapy 08/25/2009-10/28/2009    prostate 78 Gy/40 fractions  . Lung cancer 08/29/12    bx= right  upper outer lobe lung=Adenocarcinoma  . Anemia   . CAD (coronary artery disease)   . Gangrene     hx  . Hemiparesis     left , and contracture lue  . Sinus tachycardia     hx  . Seizure 10/03/2012  . CAD (coronary artery disease) 10/03/2012  . HTN (hypertension) 10/03/2012  . Stroke 10/03/2012  . Allergic rhinitis, cause unspecified 05/17/2013    ALLERGIES:  has No Known Allergies.  MEDICATIONS:  Current Outpatient Prescriptions  Medication Sig Dispense Refill  . acetaminophen (TYLENOL) 325 MG tablet Take 650 mg by mouth every 4 (four) hours as needed.      . Artificial Tear Ointment (AKWA TEARS) 0.5 % ophthalmic ointment Place into the right eye as needed.      Marland Kitchen aspirin 81 MG tablet Take 81 mg by mouth daily.      Marland Kitchen atorvastatin (LIPITOR) 20 MG tablet Take 20 mg by mouth daily.      . Cholecalciferol (VITAMIN D PO) Take by mouth daily. D 3-50 50000 units      . famciclovir (FAMVIR) 500 MG tablet Take 1 tablet (500 mg total) by mouth 3 (three) times daily.  21 tablet  0  . feeding supplement (RESOURCE BREEZE) LIQD Take 1 Container by mouth at bedtime.      Marland Kitchen FLORA-Q (FLORA-Q) CAPS capsule Take  1 capsule by mouth daily.      . Fluticasone-Salmeterol (ADVAIR) 100-50 MCG/DOSE AEPB Inhale 1 puff into the lungs every 12 (twelve) hours.      . folic acid (FOLVITE) 1 MG tablet Take 1 mg by mouth daily.      . hyaluronate sodium (RADIAPLEXRX) GEL Apply topically once.      Marland Kitchen HYDROcodone-acetaminophen (NORCO/VICODIN) 5-325 MG per tablet Take one tablet by mouth every 4 hours as needed for pain or headache  180 tablet  0  . ipratropium-albuterol (DUONEB) 0.5-2.5 (3) MG/3ML SOLN Take 3 mLs by nebulization every 6 (six) hours as needed.      . Lacosamide  (VIMPAT) 100 MG TABS Take 1 tablet twice a day  60 tablet  5  . levETIRAcetam (KEPPRA) 1000 MG tablet Take 1,000 mg by mouth 2 (two) times daily.      Marland Kitchen lidocaine-prilocaine (EMLA) cream Apply topically as needed. Apply to port approx 1 hour before chemo appt, cover with tegaderm.  30 g  0  . loratadine (CLARITIN) 10 MG tablet Take 10 mg by mouth daily.      . mirtazapine (REMERON SOL-TAB) 15 MG disintegrating tablet Take 15 mg by mouth at bedtime.      . Multiple Vitamin (MULTIVITAMIN WITH MINERALS) TABS Take 1 tablet by mouth daily.      . nitroGLYCERIN (NITROSTAT) 0.4 MG SL tablet Place 0.4 mg under the tongue every 5 (five) minutes as needed.      . phenytoin (DILANTIN) 100 MG ER capsule Take 100 mg by mouth 2 (two) times daily. Take 2 capsules bid      . pregabalin (LYRICA) 150 MG capsule Take 150 mg by mouth daily.      . prochlorperazine (COMPAZINE) 10 MG tablet Take 1 tablet (10 mg total) by mouth every 6 (six) hours as needed.  60 tablet  0  . sennosides-docusate sodium (SENOKOT-S) 8.6-50 MG tablet Take 1 tablet by mouth as directed.      . Vardenafil HCl (LEVITRA PO) Take by mouth.      . vitamin C (ASCORBIC ACID) 500 MG tablet Take 500 mg by mouth daily.       No current facility-administered medications for this visit.   Facility-Administered Medications Ordered in Other Visits  Medication Dose Route Frequency Provider Last Rate Last Dose  . sodium chloride 0.9 % injection 10 mL  10 mL Intracatheter PRN Si Gaul, MD   10 mL at 06/26/13 1253    SURGICAL HISTORY:  Past Surgical History  Procedure Laterality Date  . 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 Lung biopsy  08/29/12    RUO lung=Adenocarcinoma  . Prostate biopsy  04/22/09    Adenocarcinoma    REVIEW OF SYSTEMS:  A comprehensive review of systems was negative except for: Constitutional: positive for fatigue Respiratory: positive for dyspnea on exertion Musculoskeletal: positive  for right sided chest wall pain   PHYSICAL EXAMINATION: General appearance: alert, cooperative, fatigued and no distress Head: Normocephalic, without obvious abnormality, atraumatic Neck: no adenopathy Lymph nodes: Cervical, supraclavicular, and axillary nodes normal. Resp: wheezes bilaterally Cardio: regular rate and rhythm, S1, S2 normal, no murmur, click, rub or gallop GI: soft, non-tender; bowel sounds normal; no masses,  no organomegaly Extremities: Left AKA Neurologic: Alert and oriented X 3, normal strength and tone. Normal symmetric reflexes. Normal coordination and gait  ECOG PERFORMANCE STATUS: 2 - Symptomatic, <50% confined to bed  Blood pressure 102/58, pulse 74, temperature 97.8 F (36.6 C), temperature source Oral, resp. rate 20, height 5\' 8"  (1.727 m), weight 131 lb (59.421 kg).  LABORATORY DATA: Lab Results  Component Value Date   WBC 2.7* 06/26/2013   HGB 12.6* 06/26/2013   HCT 37.6* 06/26/2013   MCV 93.5 06/26/2013   PLT 252 06/26/2013      Chemistry      Component Value Date/Time   NA 140 06/26/2013 1014   NA 140 03/10/2009 1220   K 3.7 06/26/2013 1014   K 4.2 03/10/2009 1220   CL 105 01/25/2013 0857   CL 109 03/10/2009 1220   CO2 24 06/26/2013 1014   CO2 26 03/10/2009 1220   BUN 10.1 06/26/2013 1014   BUN 13 03/10/2009 1220   CREATININE 0.8 06/26/2013 1014   CREATININE 0.88 03/10/2009 1220      Component Value Date/Time   CALCIUM 8.5 06/26/2013 1014   CALCIUM 8.8 03/10/2009 1220   ALKPHOS 97 06/26/2013 1014   ALKPHOS 57 10/25/2007 0545   AST 20 06/26/2013 1014   AST 97* 10/25/2007 0545   ALT 23 06/26/2013 1014   ALT 32 10/25/2007 0545   BILITOT <0.20 06/26/2013 1014   BILITOT 0.8 10/25/2007 0545       RADIOGRAPHIC STUDIES: Ct Chest W Contrast  04/30/2013   *RADIOLOGY REPORT*  Clinical Data: History of lung cancer status post chemotherapy and radiation therapy which is now complete.  Restaging scan.  CT CHEST WITH CONTRAST  Technique:  Multidetector CT imaging of the  chest was performed following the standard protocol during bolus administration of intravenous contrast.  Contrast: 80mL OMNIPAQUE IOHEXOL 300 MG/ML  SOLN  Comparison: Chest CT 01/25/2013.  Findings:  Mediastinum: Right internal jugular single lumen Port-A-Cath with tip terminating in the right atrium. Heart size is normal. There is no significant pericardial fluid, thickening or pericardial calcification. There is atherosclerosis of the thoracic aorta, the great vessels of the mediastinum and the coronary arteries, including calcified atherosclerotic plaque in the left main, left anterior descending, left circumflex and right coronary arteries. Status post median sternotomy for CABG, including a LIMA to the LAD.  Enlarged low right paratracheal lymph node appears slightly larger than the prior examination, currently measuring up to 2.7 x 2.3 cm (image 19 of series 2).  Prominent subcarinal nodal tissue is also noted measuring up to 12 mm in short axis.  Extensive peribronchovascular soft tissue is noted surrounding multiple bronchi extending to the right upper and lower lobes. Esophagus is unremarkable in appearance.  Lungs/Pleura: In the right upper lobe the previously noted right upper lobe pulmonary nodule appears less distinct and slightly less bulky than the prior examination, and is now it is surrounded by extensive air space consolidation (making accurate measurement difficult), septal thickening and architectural distortion, presumably postradiation changes.  These findings affect portions of both the posterior right upper lobe as well as the superior segment of the right lower lobe.  In the central aspect of the right lower lobe (image 30 of series 2) there is increasing nodular soft tissue thickening that currently measures 2.8 x 1.3 cm.  Small right pleural effusion layering dependently is simple in appearance and new compared to the prior examination.  Linear opacities in the left lower lobe are similar  to the prior study, most compatible with areas of mild scarring.  Upper Abdomen: Unremarkable.  Musculoskeletal: Sternotomy wires. There are no aggressive appearing lytic or blastic lesions noted in the visualized portions of  the skeleton.  IMPRESSION:  1.  Today's study demonstrates evolving postradiation changes in the upper right lung, with slight decreased prominence of the previously noted right upper lobe nodule which is now largely obscured by the adjacent postradiation changes. 2.  However, there is an enlarging nodule in the central aspect of the right lower lobe which currently measures 1.3 x 2.8 cm, enlarging low right paratracheal lymphadenopathy and a prominent subcarinal lymph node, as above, concerning for metastatic disease. 3.  New small right-sided pleural effusion is simple in appearance. 4. Atherosclerosis, including left main and three-vessel coronary artery disease.   Status post CABG.   Original Report Authenticated By: Trudie Reed, M.D.   Nm Pet Image Restag (ps) Skull Base To Thigh  05/11/2013   *RADIOLOGY REPORT*  Clinical Data: Subsequent treatment strategy for restaging of lung cancer.  NUCLEAR MEDICINE PET SKULL BASE TO THIGH  Fasting Blood Glucose:  88  Technique:  16.2 mCi F-18 FDG was injected intravenously. CT data was obtained and used for attenuation correction and anatomic localization only.  (This was not acquired as a diagnostic CT examination.) Additional exam technical data entered on technologist worksheet.  Comparison:  04/30/2013 chest CT.  Prior PET of 08/21/2012 from [facility]  Findings:  Neck: Development of a hypermetabolic left supraclavicular/lower jugular node.  This measures 1.0 cm and a S.U.V. max of 15.1 on image 53/series 2.  Chest:  Hypermetabolic right paratracheal adenopathy.  This measures 2.1 cm and a S.U.V. max of 13.1 on image 71/series 2.  On the prior PET, this had a maximum s u v of 11.4.  Hypermetabolic subcarinal and  prevascular adenopathy as well.  Hypermetabolism corresponding to borderline enlarged left hilar nodal tissue.  This measures a S.U.V. max of 7.6 on image 86/series 2.  This is new since the prior PET.  As detailed on the prior diagnostic CT, right infrahilar nodal tissue corresponds to hypermetabolism.  This measures a S.U.V. max of 15.1 on image 87/series 2.  There is diffuse right upper lobe and adjacent superior segment right lower lobe hypermetabolism, corresponding to airspace disease and septal thickening.  Primarily felt to be radiation induced. More focal area of right upper lobe hypermetabolism corresponds to residual nodular component which measures 2.0 cm and a S.U.V. max of 11.5 on image 71/series 2.  Abdomen/Pelvis:  No abnormal hypermetabolism.  Skeleton:  Spinous process hypermetabolism in the lower lumbar spine is favored to be degenerative.  CT  images performed for attenuation correction demonstrate no further findings within the neck.  Chest findings deferred to recent diagnostic CT.  A small right- sided pleural effusion is not significantly changed.  Right nephrolithiasis.  Densely calcified inferior mesenteric artery.  Old granulomatous disease in the spleen.  Radiation seeds in the prostate.  Probable hydroceles.  Mild osteopenia.  Left iliac bone island.  IMPRESSION:  1.  Since 08/21/2012, progression of metastatic disease with nodal metastasis in the low neck, bilateral hila, and bilateral mediastinal stations. 2.  Radiation change throughout the right lung.  Suspect residual nodule centrally in the right upper lobe. 3.  Small right pleural effusion, similar to most recent CT.   Original Report Authenticated By: Jeronimo Greaves, M.D.    ASSESSMENT AND PLAN: This is a very pleasant 67 years old African American male with now metastatic non-small cell lung cancer, adenocarcinoma. Is currently being treated with systemic chemotherapy in the form of carboplatin for an AUC of 5 and Alimta at 500  mg region  squared given every 3 weeks, status post 1 cycle. He is neutropenic today with a total white count 2.7 and absolute neutrophil count of 1.2.  The patient was discussed with also seen by Dr. Arbutus Ped. We will proceed with cycle #2 of his systemic chemotherapy with carboplatin and Alimta however he will be dose reduced his carboplatin will be given at an AUC of 4 and is Alimta root will be reduced to 400 mg per meter squared given every 3 weeks. Additionally to address his neutropenia with ANC 1.2 he will return tomorrow for Neulasta injection for neutrophil support. Both the patient and his caregiver voiced understanding. For pain relief he is to continue with his Vicodin 5/325 mg tablets one tablet by mouth every 4 hours as needed for pain. He is to continue with weekly labs as scheduled and return in 3 weeks prior to the start of cycle #3 of his systemic chemotherapy with carboplatin and Alimta.  Laural Benes, Philip Simkin E, PA-C   He was advised to call immediately if he has any concerning symptoms in the interval.  The patient voices understanding of current disease status and treatment options and is in agreement with the current care plan.  All questions were answered. The patient knows to call the clinic with any problems, questions or concerns. We can certainly see the patient much sooner if necessary.  ADDENDUM: Hematology/Oncology Attending: I have a face to face encounter with the patient. I recommended his care plan. The patient had metastatic non-small cell lung cancer and currently undergoing systemic chemotherapy with carboplatin and Alimta status post 1 cycle. He tolerated the first cycle of his treatment fairly well except for neutropenia. His absolute neutrophil count is 1200 today. I recommended for the patient to proceed with the second cycle of his chemotherapy today as scheduled. I would reduce the dose of carboplatin to AUC of 4 and Alimta at 400 mg/M2. I will also arrange for the  patient to receive Neulasta injection tomorrow. He would come back for followup visit in 3 weeks with the next cycle of his chemotherapy. He was advised to call immediately if he has any concerning symptoms in the interval. Lajuana Matte., MD 06/26/2013

## 2013-06-26 NOTE — Telephone Encounter (Signed)
Per staff message and POF I have scheduled appts.  JMW  

## 2013-06-26 NOTE — Telephone Encounter (Signed)
gv andprinted appt sched and avs for pt for OCT emailed MW to add tx...  °

## 2013-06-26 NOTE — Patient Instructions (Addendum)
[facility] Discharge Instructions for Patients Receiving Chemotherapy  Today you received the following chemotherapy agents Carboplatin/Alimta  To help prevent nausea and vomiting after your treatment, we encourage you to take your nausea medication as needed   If you develop nausea and vomiting that is not controlled by your nausea medication, call the clinic.   BELOW ARE SYMPTOMS THAT SHOULD BE REPORTED IMMEDIATELY:  *FEVER GREATER THAN 100.5 F  *CHILLS WITH OR WITHOUT FEVER  NAUSEA AND VOMITING THAT IS NOT CONTROLLED WITH YOUR NAUSEA MEDICATION  *UNUSUAL SHORTNESS OF BREATH  *UNUSUAL BRUISING OR BLEEDING  TENDERNESS IN MOUTH AND THROAT WITH OR WITHOUT PRESENCE OF ULCERS  *URINARY PROBLEMS  *BOWEL PROBLEMS  UNUSUAL RASH Items with * indicate a potential emergency and should be followed up as soon as possible.  Feel free to call the clinic you have any questions or concerns. The clinic phone number is [phone].    

## 2013-06-26 NOTE — Patient Instructions (Addendum)
Return tomorrow 06/27/13 for a Neulasta injection  Continue with weekly labs as scheduled Follow up in 3 weeks, prior to your next scheduled cycle of chemotherapy

## 2013-06-27 ENCOUNTER — Ambulatory Visit (HOSPITAL_BASED_OUTPATIENT_CLINIC_OR_DEPARTMENT_OTHER): Payer: PRIVATE HEALTH INSURANCE

## 2013-06-27 ENCOUNTER — Telehealth: Payer: Self-pay | Admitting: *Deleted

## 2013-06-27 VITALS — BP 98/48 | HR 74 | Temp 98.1°F

## 2013-06-27 DIAGNOSIS — D701 Agranulocytosis secondary to cancer chemotherapy: Secondary | ICD-10-CM

## 2013-06-27 DIAGNOSIS — C3410 Malignant neoplasm of upper lobe, unspecified bronchus or lung: Secondary | ICD-10-CM

## 2013-06-27 DIAGNOSIS — Z5189 Encounter for other specified aftercare: Secondary | ICD-10-CM

## 2013-06-27 MED ORDER — PEGFILGRASTIM INJECTION 6 MG/0.6ML
6.0000 mg | Freq: Once | SUBCUTANEOUS | Status: AC
  Administered 2013-06-27: 6 mg via SUBCUTANEOUS
  Filled 2013-06-27: qty 0.6

## 2013-06-27 NOTE — Patient Instructions (Addendum)

## 2013-06-27 NOTE — Telephone Encounter (Signed)
Patient here for Neulasta injection following 1st time [facility] chemotherapy  States not having any nausea, vomiting or diarrhea.  Is drinking and eating well.  Knows to call if he has any problems or ca=oncerns.

## 2013-07-03 ENCOUNTER — Encounter: Payer: Self-pay | Admitting: Radiation Oncology

## 2013-07-03 ENCOUNTER — Other Ambulatory Visit (HOSPITAL_BASED_OUTPATIENT_CLINIC_OR_DEPARTMENT_OTHER): Payer: PRIVATE HEALTH INSURANCE | Admitting: Lab

## 2013-07-03 DIAGNOSIS — C3490 Malignant neoplasm of unspecified part of unspecified bronchus or lung: Secondary | ICD-10-CM

## 2013-07-03 DIAGNOSIS — I2510 Atherosclerotic heart disease of native coronary artery without angina pectoris: Secondary | ICD-10-CM

## 2013-07-03 DIAGNOSIS — R569 Unspecified convulsions: Secondary | ICD-10-CM

## 2013-07-03 DIAGNOSIS — I10 Essential (primary) hypertension: Secondary | ICD-10-CM

## 2013-07-03 DIAGNOSIS — I639 Cerebral infarction, unspecified: Secondary | ICD-10-CM

## 2013-07-03 DIAGNOSIS — C3410 Malignant neoplasm of upper lobe, unspecified bronchus or lung: Secondary | ICD-10-CM

## 2013-07-03 LAB — CBC WITH DIFFERENTIAL/PLATELET
BASO%: 0.6 % (ref 0.0–2.0)
EOS%: 0.8 % (ref 0.0–7.0)
HCT: 37.9 % — ABNORMAL LOW (ref 38.4–49.9)
MCH: 31.5 pg (ref 27.2–33.4)
MCHC: 33.8 g/dL (ref 32.0–36.0)
MCV: 93.3 fL (ref 79.3–98.0)
MONO%: 7.4 % (ref 0.0–14.0)
NEUT%: 75.3 % — ABNORMAL HIGH (ref 39.0–75.0)
Platelets: 64 10*3/uL — ABNORMAL LOW (ref 140–400)
RDW: 14.2 % (ref 11.0–14.6)
lymph#: 0.6 10*3/uL — ABNORMAL LOW (ref 0.9–3.3)

## 2013-07-03 LAB — COMPREHENSIVE METABOLIC PANEL (CC13)
ALT: 25 U/L (ref 0–55)
AST: 23 U/L (ref 5–34)
Albumin: 3.1 g/dL — ABNORMAL LOW (ref 3.5–5.0)
Alkaline Phosphatase: 122 U/L (ref 40–150)
BUN: 13.8 mg/dL (ref 7.0–26.0)
Creatinine: 0.8 mg/dL (ref 0.7–1.3)
Glucose: 97 mg/dl (ref 70–140)
Potassium: 4.2 mEq/L (ref 3.5–5.1)
Sodium: 140 mEq/L (ref 136–145)
Total Bilirubin: 0.7 mg/dL (ref 0.20–1.20)
Total Protein: 8.3 g/dL (ref 6.4–8.3)

## 2013-07-05 ENCOUNTER — Ambulatory Visit: Payer: Medicare Other | Admitting: Radiation Oncology

## 2013-07-10 ENCOUNTER — Other Ambulatory Visit (HOSPITAL_BASED_OUTPATIENT_CLINIC_OR_DEPARTMENT_OTHER): Payer: PRIVATE HEALTH INSURANCE

## 2013-07-10 ENCOUNTER — Encounter (INDEPENDENT_AMBULATORY_CARE_PROVIDER_SITE_OTHER): Payer: Self-pay

## 2013-07-10 DIAGNOSIS — C3410 Malignant neoplasm of upper lobe, unspecified bronchus or lung: Secondary | ICD-10-CM

## 2013-07-10 DIAGNOSIS — C3491 Malignant neoplasm of unspecified part of right bronchus or lung: Secondary | ICD-10-CM

## 2013-07-10 LAB — COMPREHENSIVE METABOLIC PANEL (CC13)
Alkaline Phosphatase: 131 U/L (ref 40–150)
Anion Gap: 8 mEq/L (ref 3–11)
BUN: 9.7 mg/dL (ref 7.0–26.0)
CO2: 28 mEq/L (ref 22–29)
Creatinine: 0.9 mg/dL (ref 0.7–1.3)
Glucose: 97 mg/dl (ref 70–140)
Sodium: 141 mEq/L (ref 136–145)
Total Bilirubin: <0.2 mg/dL (ref 0.20–1.20)
Total Protein: 7.8 g/dL (ref 6.4–8.3)

## 2013-07-10 LAB — CBC WITH DIFFERENTIAL/PLATELET
Eosinophils Absolute: 0 10*3/uL (ref 0.0–0.5)
HCT: 35 % — ABNORMAL LOW (ref 38.4–49.9)
LYMPH%: 9.4 % — ABNORMAL LOW (ref 14.0–49.0)
MCV: 94.2 fL (ref 79.3–98.0)
MONO#: 0.7 10*3/uL (ref 0.1–0.9)
MONO%: 7 % (ref 0.0–14.0)
NEUT#: 8.1 10*3/uL — ABNORMAL HIGH (ref 1.5–6.5)
NEUT%: 82.8 % — ABNORMAL HIGH (ref 39.0–75.0)
Platelets: 85 10*3/uL — ABNORMAL LOW (ref 140–400)
RBC: 3.71 10*6/uL — ABNORMAL LOW (ref 4.20–5.82)
WBC: 9.8 10*3/uL (ref 4.0–10.3)

## 2013-07-12 ENCOUNTER — Ambulatory Visit
Admission: RE | Admit: 2013-07-12 | Discharge: 2013-07-12 | Disposition: A | Discharge status: Home / Self Care | Payer: PRIVATE HEALTH INSURANCE | Source: Ambulatory Visit | Attending: Radiation Oncology | Admitting: Radiation Oncology

## 2013-07-12 ENCOUNTER — Encounter: Payer: Self-pay | Admitting: Radiation Oncology

## 2013-07-12 NOTE — Progress Notes (Signed)
Follow up rad tx lung,  10/25/12-12/14/12 66 Gy;patient unable to get weighed, ABK left , patient difficult to understand speech at times, c/o left shoulder pain, says an 8 -1-10 scale, drinking coffee, will update meds from nursing home, last pet image done 05/11/13 , no coughing, says last pain med taken last night, feels he has a cold, nose runny clear

## 2013-07-12 NOTE — Progress Notes (Signed)
Radiation Oncology         [phone]) [phone] ________________________________  Name: [name] MRN: [id number]  Date: 07/12/2013  DOB: 12-30-1945  Follow-Up Visit Note  CC: Conni Slipper, PA-C  Saunders Revel, MD  Diagnosis:   Metastatic lung cancer, non-small cell  Interval Since Last Radiation:  The patient completed treatment in February of 2014   Narrative:  The patient returns today for routine follow-up.  The patient states that he is doing very well. He does have a bloody nose and some occasional pain in the left shoulder region. He states that he is doing well with chemotherapy. No shortness of breath or chest discomfort.                              ALLERGIES:  has No Known Allergies.  Meds: Current Outpatient Prescriptions  Medication Sig Dispense Refill  . Alum & Mag Hydroxide-Simeth (MAGIC MOUTHWASH W/LIDOCAINE) SOLN Take 5 mLs by mouth 4 (four) times daily as needed.      Marland Kitchen dexamethasone (DECADRON) 4 MG tablet Take 4 mg by mouth 2 (two) times daily with a meal.      . acetaminophen (TYLENOL) 325 MG tablet Take 650 mg by mouth every 4 (four) hours as needed.      . Artificial Tear Ointment (AKWA TEARS) 0.5 % ophthalmic ointment Place into the right eye as needed.      Marland Kitchen aspirin 81 MG tablet Take 81 mg by mouth daily.      Marland Kitchen atorvastatin (LIPITOR) 20 MG tablet Take 20 mg by mouth daily.      . Cholecalciferol (VITAMIN D PO) Take by mouth daily. D 3-50 50000 units      . famciclovir (FAMVIR) 500 MG tablet Take 1 tablet (500 mg total) by mouth 3 (three) times daily.  21 tablet  0  . feeding supplement (RESOURCE BREEZE) LIQD Take 1 Container by mouth at bedtime.      Marland Kitchen FLORA-Q (FLORA-Q) CAPS capsule Take 1 capsule by mouth daily.      . Fluticasone-Salmeterol (ADVAIR) 100-50 MCG/DOSE AEPB Inhale 1 puff into the lungs every 12 (twelve) hours.      . folic acid (FOLVITE) 1 MG tablet Take 1 mg by mouth daily.      . hyaluronate sodium (RADIAPLEXRX) GEL Apply topically  once.      Marland Kitchen HYDROcodone-acetaminophen (NORCO/VICODIN) 5-325 MG per tablet Take one tablet by mouth every 4 hours as needed for pain or headache  180 tablet  0  . ipratropium-albuterol (DUONEB) 0.5-2.5 (3) MG/3ML SOLN Take 3 mLs by nebulization every 6 (six) hours as needed.      . Lacosamide (VIMPAT) 100 MG TABS Take 1 tablet twice a day  60 tablet  5  . levETIRAcetam (KEPPRA) 1000 MG tablet Take 1,000 mg by mouth 2 (two) times daily.      Marland Kitchen lidocaine-prilocaine (EMLA) cream Apply topically as needed. Apply to port approx 1 hour before chemo appt, cover with tegaderm.  30 g  0  . loratadine (CLARITIN) 10 MG tablet Take 10 mg by mouth daily.      . mirtazapine (REMERON SOL-TAB) 15 MG disintegrating tablet Take 15 mg by mouth at bedtime.      . Multiple Vitamin (MULTIVITAMIN WITH MINERALS) TABS Take 1 tablet by mouth daily.      . nitroGLYCERIN (NITROSTAT) 0.4 MG SL tablet Place 0.4 mg under the tongue every 5 (five)  minutes as needed.      . phenytoin (DILANTIN) 100 MG ER capsule Take 100 mg by mouth 2 (two) times daily. Take 2 capsules bid      . pregabalin (LYRICA) 150 MG capsule Take 150 mg by mouth daily.      . prochlorperazine (COMPAZINE) 10 MG tablet Take 1 tablet (10 mg total) by mouth every 6 (six) hours as needed.  60 tablet  0  . sennosides-docusate sodium (SENOKOT-S) 8.6-50 MG tablet Take 1 tablet by mouth as directed.      . Vardenafil HCl (LEVITRA PO) Take by mouth.      . vitamin C (ASCORBIC ACID) 500 MG tablet Take 500 mg by mouth daily.       No current facility-administered medications for this encounter.    Physical Findings: The patient is in no acute distress. Patient is alert and oriented.  oral temperature is 98.2 F (36.8 C). His blood pressure is 115/60 and his pulse is 78. His respiration is 20 and oxygen saturation is 100%. .   General: Well-developed, in no acute distress HEENT: Normocephalic, atraumatic Cardiovascular: Regular rate and rhythm Respiratory: Clear  to auscultation bilaterally GI: Soft, nontender, normal bowel sounds Extremities: No edema present   Lab Findings: Lab Results  Component Value Date   WBC 9.8 07/10/2013   HGB 11.7* 07/10/2013   HCT 35.0* 07/10/2013   MCV 94.2 07/10/2013   PLT 85* 07/10/2013     Radiographic Findings: No results found.  Impression:    The patient is doing reasonably well at this time. No acute issues/complaints today. He is continuing on chemotherapy. I had a chance to personally reviewing his PET scan which did not show any candidate areas for palliative radiation treatment at this time. No significant activity in the left shoulder region.  Plan:  Followup on a when necessary basis.  I spent 10 minutes with the patient today, the majority of which was spent counseling the patient on the diagnosis of cancer and coordinating care.   Radene Gunning, M.D., Ph.D.

## 2013-07-16 ENCOUNTER — Other Ambulatory Visit: Payer: Self-pay | Admitting: Internal Medicine

## 2013-07-17 ENCOUNTER — Ambulatory Visit: Payer: PRIVATE HEALTH INSURANCE | Admitting: Physician Assistant

## 2013-07-17 ENCOUNTER — Ambulatory Visit (HOSPITAL_BASED_OUTPATIENT_CLINIC_OR_DEPARTMENT_OTHER): Payer: PRIVATE HEALTH INSURANCE

## 2013-07-17 ENCOUNTER — Telehealth: Payer: Self-pay | Admitting: Internal Medicine

## 2013-07-17 ENCOUNTER — Telehealth: Payer: Self-pay | Admitting: *Deleted

## 2013-07-17 ENCOUNTER — Ambulatory Visit (HOSPITAL_BASED_OUTPATIENT_CLINIC_OR_DEPARTMENT_OTHER): Payer: PRIVATE HEALTH INSURANCE | Admitting: Physician Assistant

## 2013-07-17 ENCOUNTER — Encounter (INDEPENDENT_AMBULATORY_CARE_PROVIDER_SITE_OTHER): Payer: Self-pay

## 2013-07-17 ENCOUNTER — Other Ambulatory Visit (HOSPITAL_BASED_OUTPATIENT_CLINIC_OR_DEPARTMENT_OTHER): Payer: PRIVATE HEALTH INSURANCE | Admitting: Lab

## 2013-07-17 DIAGNOSIS — Z5111 Encounter for antineoplastic chemotherapy: Secondary | ICD-10-CM

## 2013-07-17 DIAGNOSIS — C3410 Malignant neoplasm of upper lobe, unspecified bronchus or lung: Secondary | ICD-10-CM

## 2013-07-17 DIAGNOSIS — I2510 Atherosclerotic heart disease of native coronary artery without angina pectoris: Secondary | ICD-10-CM

## 2013-07-17 DIAGNOSIS — R569 Unspecified convulsions: Secondary | ICD-10-CM

## 2013-07-17 DIAGNOSIS — I10 Essential (primary) hypertension: Secondary | ICD-10-CM

## 2013-07-17 DIAGNOSIS — I639 Cerebral infarction, unspecified: Secondary | ICD-10-CM

## 2013-07-17 DIAGNOSIS — C3491 Malignant neoplasm of unspecified part of right bronchus or lung: Secondary | ICD-10-CM

## 2013-07-17 DIAGNOSIS — C3490 Malignant neoplasm of unspecified part of unspecified bronchus or lung: Secondary | ICD-10-CM

## 2013-07-17 LAB — COMPREHENSIVE METABOLIC PANEL (CC13)
Albumin: 2.8 g/dL — ABNORMAL LOW (ref 3.5–5.0)
Alkaline Phosphatase: 129 U/L (ref 40–150)
Anion Gap: 9 mEq/L (ref 3–11)
CO2: 25 mEq/L (ref 22–29)
Calcium: 8.5 mg/dL (ref 8.4–10.4)
Glucose: 102 mg/dl (ref 70–140)
Potassium: 3.9 mEq/L (ref 3.5–5.1)
Sodium: 140 mEq/L (ref 136–145)
Total Protein: 7.6 g/dL (ref 6.4–8.3)

## 2013-07-17 LAB — CBC WITH DIFFERENTIAL/PLATELET
BASO%: 0.3 % (ref 0.0–2.0)
Basophils Absolute: 0 10*3/uL (ref 0.0–0.1)
EOS%: 1.3 % (ref 0.0–7.0)
Eosinophils Absolute: 0.1 10*3/uL (ref 0.0–0.5)
HCT: 37.5 % — ABNORMAL LOW (ref 38.4–49.9)
HGB: 12.5 g/dL — ABNORMAL LOW (ref 13.0–17.1)
MCHC: 33.3 g/dL (ref 32.0–36.0)
MONO#: 1 10*3/uL — ABNORMAL HIGH (ref 0.1–0.9)
NEUT#: 7.8 10*3/uL — ABNORMAL HIGH (ref 1.5–6.5)
NEUT%: 76.3 % — ABNORMAL HIGH (ref 39.0–75.0)
Platelets: 242 10*3/uL (ref 140–400)
WBC: 10.2 10*3/uL (ref 4.0–10.3)
lymph#: 1.3 10*3/uL (ref 0.9–3.3)

## 2013-07-17 MED ORDER — SODIUM CHLORIDE 0.9 % IV SOLN
Freq: Once | INTRAVENOUS | Status: AC
  Administered 2013-07-17: 14:00:00 via INTRAVENOUS

## 2013-07-17 MED ORDER — SODIUM CHLORIDE 0.9 % IV SOLN
400.0000 mg/m2 | Freq: Once | INTRAVENOUS | Status: AC
  Administered 2013-07-17: 700 mg via INTRAVENOUS
  Filled 2013-07-17: qty 28

## 2013-07-17 MED ORDER — CYANOCOBALAMIN 1000 MCG/ML IJ SOLN
1000.0000 ug | Freq: Once | INTRAMUSCULAR | Status: AC
  Administered 2013-07-17: 1000 ug via INTRAMUSCULAR

## 2013-07-17 MED ORDER — CYANOCOBALAMIN 1000 MCG/ML IJ SOLN
INTRAMUSCULAR | Status: AC
  Filled 2013-07-17: qty 1

## 2013-07-17 MED ORDER — ONDANSETRON 16 MG/50ML IVPB (CHCC)
16.0000 mg | Freq: Once | INTRAVENOUS | Status: AC
  Administered 2013-07-17: 16 mg via INTRAVENOUS

## 2013-07-17 MED ORDER — DEXAMETHASONE SODIUM PHOSPHATE 20 MG/5ML IJ SOLN
20.0000 mg | Freq: Once | INTRAMUSCULAR | Status: AC
  Administered 2013-07-17: 20 mg via INTRAVENOUS

## 2013-07-17 MED ORDER — SODIUM CHLORIDE 0.9 % IJ SOLN
10.0000 mL | INTRAMUSCULAR | Status: DC | PRN
  Administered 2013-07-17: 10 mL
  Filled 2013-07-17: qty 10

## 2013-07-17 MED ORDER — ONDANSETRON 16 MG/50ML IVPB (CHCC)
INTRAVENOUS | Status: AC
  Filled 2013-07-17: qty 16

## 2013-07-17 MED ORDER — SODIUM CHLORIDE 0.9 % IV SOLN
441.0000 mg | Freq: Once | INTRAVENOUS | Status: AC
  Administered 2013-07-17: 440 mg via INTRAVENOUS
  Filled 2013-07-17: qty 44

## 2013-07-17 MED ORDER — HEPARIN SOD (PORK) LOCK FLUSH 100 UNIT/ML IV SOLN
500.0000 [IU] | Freq: Once | INTRAVENOUS | Status: AC | PRN
  Administered 2013-07-17: 500 [IU]
  Filled 2013-07-17: qty 5

## 2013-07-17 MED ORDER — DEXAMETHASONE SODIUM PHOSPHATE 20 MG/5ML IJ SOLN
INTRAMUSCULAR | Status: AC
  Filled 2013-07-17: qty 5

## 2013-07-17 NOTE — Progress Notes (Signed)
[facility] [phone]   [phone]  OFFICE PROGRESS NOTE  Travelle Mcclimans E, PA-C [address] [facility] [place] [address]  DIAGNOSIS: Metastatic non-small cell lung cancer initially diagnosed as stage IIIA (T1a, N2, M0) non-small cell lung cancer, adenocarcinoma diagnosed in December of 2013.   PRIOR THERAPY: concurrent chemoradiation with weekly carboplatin for AUC of 2 and paclitaxel 45 mg/M2, status post 4 weekly doses of chemotherapy last dose was given on 11/06/2012 and last fraction of radiotherapy 11/07/2012.   CURRENT THERAPY: Systemic chemotherapy with carboplatin for AUC of 5 and Alimta 500 mg/M2 every 3 weeks. First dose given on 06/05/2013. Status post 2 cycles  CHEMOTHERAPY INTENT: Palliative  CURRENT # OF CHEMOTHERAPY CYCLES: 3  CURRENT ANTIEMETICS: Zofran, dexamethasone and Compazine  CURRENT SMOKING STATUS:  Former smoker  ORAL CHEMOTHERAPY AND CONSENT: None  CURRENT BISPHOSPHONATES USE: None  PAIN MANAGEMENT: 0/10  NARCOTICS INDUCED CONSTIPATION: None  LIVING WILL AND CODE STATUS: Full code   INTERVAL HISTORY: Vanderbilt Situ 67 y.o. male returns to the clinic today for a symptom management visit accompanied by his caregiver from the skilled nursing facility. The patient is feeling fine today with no specific complaints.  He denied having any significant weight loss or night sweats. Overall he is tolerating his chemotherapy without difficulty.  He continues to have shortness breath with exertion with no cough or hemoptysis. He presents today to proceed with cycle #3 of his systemic chemotherapy with carboplatin and Alimta.  MEDICAL HISTORY: Past Medical History  Diagnosis Date  . Prostate cancer 04/22/09    Adenomacarcinoma,glleason=#=#=6,volume=32.9cc,PSA=5.91  . Gout     hx  . Myocardial infarction   . Anxiety   . Depression   . PVD (peripheral vascular disease)   . Stroke     hx  syndrome  . Seizures   . Hypertension   . Hypercholesterolemia   . History of radiation therapy 08/25/2009-10/28/2009    prostate 78 Gy/40 fractions  . Lung cancer 08/29/12    bx= right  upper outer lobe lung=Adenocarcinoma  . Anemia   . CAD (coronary artery disease)   . Gangrene     hx  . Hemiparesis     left , and contracture lue  . Sinus tachycardia     hx  . Seizure 10/03/2012  . CAD (coronary artery disease) 10/03/2012  . HTN (hypertension) 10/03/2012  . Stroke 10/03/2012  . Allergic rhinitis, cause unspecified 05/17/2013  . History of radiation therapy 10/25/12-12/14/12    lung 66Gy    ALLERGIES:  has No Known Allergies.  MEDICATIONS:  Current Outpatient Prescriptions  Medication Sig Dispense Refill  . acetaminophen (TYLENOL) 325 MG tablet Take 650 mg by mouth every 4 (four) hours as needed.      . Alum & Mag Hydroxide-Simeth (MAGIC MOUTHWASH W/LIDOCAINE) SOLN Take 5 mLs by mouth 4 (four) times daily as needed.      . Artificial Tear Ointment (AKWA TEARS) 0.5 % ophthalmic ointment Place into the right eye as needed.      Marland Kitchen aspirin 81 MG tablet Take 81 mg by mouth daily.      Marland Kitchen atorvastatin (LIPITOR) 20 MG tablet Take 20 mg by mouth daily.      . Cholecalciferol (VITAMIN D PO) Take by mouth daily. D 3-50 50000 units      . dexamethasone (DECADRON) 4 MG tablet Take 4 mg by mouth 2 (two) times daily with a meal.      .  famciclovir (FAMVIR) 500 MG tablet Take 1 tablet (500 mg total) by mouth 3 (three) times daily.  21 tablet  0  . feeding supplement (RESOURCE BREEZE) LIQD Take 1 Container by mouth at bedtime.      Marland Kitchen FLORA-Q (FLORA-Q) CAPS capsule Take 1 capsule by mouth daily.      . Fluticasone-Salmeterol (ADVAIR) 100-50 MCG/DOSE AEPB Inhale 1 puff into the lungs every 12 (twelve) hours.      . folic acid (FOLVITE) 1 MG tablet Take 1 mg by mouth daily.      . hyaluronate sodium (RADIAPLEXRX) GEL Apply topically once.      Marland Kitchen HYDROcodone-acetaminophen (NORCO/VICODIN) 5-325 MG per tablet  Take one tablet by mouth every 4 hours as needed for pain or headache  180 tablet  0  . ipratropium-albuterol (DUONEB) 0.5-2.5 (3) MG/3ML SOLN Take 3 mLs by nebulization every 6 (six) hours as needed.      . Lacosamide (VIMPAT) 100 MG TABS Take 1 tablet twice a day  60 tablet  5  . levETIRAcetam (KEPPRA) 1000 MG tablet Take 1,000 mg by mouth 2 (two) times daily.      Marland Kitchen lidocaine-prilocaine (EMLA) cream Apply topically as needed. Apply to port approx 1 hour before chemo appt, cover with tegaderm.  30 g  0  . loratadine (CLARITIN) 10 MG tablet Take 10 mg by mouth daily.      . mirtazapine (REMERON SOL-TAB) 15 MG disintegrating tablet Take 15 mg by mouth at bedtime.      . Multiple Vitamin (MULTIVITAMIN WITH MINERALS) TABS Take 1 tablet by mouth daily.      . nitroGLYCERIN (NITROSTAT) 0.4 MG SL tablet Place 0.4 mg under the tongue every 5 (five) minutes as needed.      . phenytoin (DILANTIN) 100 MG ER capsule Take 100 mg by mouth 2 (two) times daily. Take 2 capsules bid      . pregabalin (LYRICA) 150 MG capsule Take 150 mg by mouth daily.      . prochlorperazine (COMPAZINE) 10 MG tablet Take 1 tablet (10 mg total) by mouth every 6 (six) hours as needed.  60 tablet  0  . sennosides-docusate sodium (SENOKOT-S) 8.6-50 MG tablet Take 1 tablet by mouth as directed.      . Vardenafil HCl (LEVITRA PO) Take by mouth.      . vitamin C (ASCORBIC ACID) 500 MG tablet Take 500 mg by mouth daily.       No current facility-administered medications for this visit.    SURGICAL HISTORY:  Past Surgical History  Procedure Laterality Date  . 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 Lung biopsy  08/29/12    RUO lung=Adenocarcinoma  . Prostate biopsy  04/22/09    Adenocarcinoma    REVIEW OF SYSTEMS:  A comprehensive review of systems was negative except for: Constitutional: positive for fatigue Respiratory: positive for dyspnea on exertion   PHYSICAL EXAMINATION: General  appearance: alert, cooperative, fatigued and no distress Head: Normocephalic, without obvious abnormality, atraumatic Neck: no adenopathy Lymph nodes: Cervical, supraclavicular, and axillary nodes normal. Resp: wheezes bilaterally Cardio: regular rate and rhythm, S1, S2 normal, no murmur, click, rub or gallop GI: soft, non-tender; bowel sounds normal; no masses,  no organomegaly Extremities: Left AKA Neurologic: Alert and oriented X 3, normal strength and tone. Normal symmetric reflexes. Normal coordination and gait  ECOG PERFORMANCE STATUS: 2 - Symptomatic, <50% confined to bed  Blood pressure 93/60,  pulse 98, temperature 97.9 F (36.6 C), temperature source Oral, resp. rate 20, height 5\' 8"  (1.727 m).  LABORATORY DATA: Lab Results  Component Value Date   WBC 10.2 07/17/2013   HGB 12.5* 07/17/2013   HCT 37.5* 07/17/2013   MCV 94.2 07/17/2013   PLT 242 07/17/2013      Chemistry      Component Value Date/Time   NA 140 07/17/2013 1147   NA 140 03/10/2009 1220   K 3.9 07/17/2013 1147   K 4.2 03/10/2009 1220   CL 105 01/25/2013 0857   CL 109 03/10/2009 1220   CO2 25 07/17/2013 1147   CO2 26 03/10/2009 1220   BUN 8.2 07/17/2013 1147   BUN 13 03/10/2009 1220   CREATININE 0.8 07/17/2013 1147   CREATININE 0.88 03/10/2009 1220      Component Value Date/Time   CALCIUM 8.5 07/17/2013 1147   CALCIUM 8.8 03/10/2009 1220   ALKPHOS 129 07/17/2013 1147   ALKPHOS 57 10/25/2007 0545   AST 16 07/17/2013 1147   AST 97* 10/25/2007 0545   ALT 16 07/17/2013 1147   ALT 32 10/25/2007 0545   BILITOT <0.20 07/17/2013 1147   BILITOT 0.8 10/25/2007 0545       RADIOGRAPHIC STUDIES: Ct Chest W Contrast  04/30/2013   *RADIOLOGY REPORT*  Clinical Data: History of lung cancer status post chemotherapy and radiation therapy which is now complete.  Restaging scan.  CT CHEST WITH CONTRAST  Technique:  Multidetector CT imaging of the chest was performed following the standard protocol during bolus  administration of intravenous contrast.  Contrast: 80mL OMNIPAQUE IOHEXOL 300 MG/ML  SOLN  Comparison: Chest CT 01/25/2013.  Findings:  Mediastinum: Right internal jugular single lumen Port-A-Cath with tip terminating in the right atrium. Heart size is normal. There is no significant pericardial fluid, thickening or pericardial calcification. There is atherosclerosis of the thoracic aorta, the great vessels of the mediastinum and the coronary arteries, including calcified atherosclerotic plaque in the left main, left anterior descending, left circumflex and right coronary arteries. Status post median sternotomy for CABG, including a LIMA to the LAD.  Enlarged low right paratracheal lymph node appears slightly larger than the prior examination, currently measuring up to 2.7 x 2.3 cm (image 19 of series 2).  Prominent subcarinal nodal tissue is also noted measuring up to 12 mm in short axis.  Extensive peribronchovascular soft tissue is noted surrounding multiple bronchi extending to the right upper and lower lobes. Esophagus is unremarkable in appearance.  Lungs/Pleura: In the right upper lobe the previously noted right upper lobe pulmonary nodule appears less distinct and slightly less bulky than the prior examination, and is now it is surrounded by extensive air space consolidation (making accurate measurement difficult), septal thickening and architectural distortion, presumably postradiation changes.  These findings affect portions of both the posterior right upper lobe as well as the superior segment of the right lower lobe.  In the central aspect of the right lower lobe (image 30 of series 2) there is increasing nodular soft tissue thickening that currently measures 2.8 x 1.3 cm.  Small right pleural effusion layering dependently is simple in appearance and new compared to the prior examination.  Linear opacities in the left lower lobe are similar to the prior study, most compatible with areas of mild scarring.   Upper Abdomen: Unremarkable.  Musculoskeletal: Sternotomy wires. There are no aggressive appearing lytic or blastic lesions noted in the visualized portions of the skeleton.  IMPRESSION:  1.  Today's  study demonstrates evolving postradiation changes in the upper right lung, with slight decreased prominence of the previously noted right upper lobe nodule which is now largely obscured by the adjacent postradiation changes. 2.  However, there is an enlarging nodule in the central aspect of the right lower lobe which currently measures 1.3 x 2.8 cm, enlarging low right paratracheal lymphadenopathy and a prominent subcarinal lymph node, as above, concerning for metastatic disease. 3.  New small right-sided pleural effusion is simple in appearance. 4. Atherosclerosis, including left main and three-vessel coronary artery disease.   Status post CABG.   Original Report Authenticated By: Trudie Reed, M.D.   Nm Pet Image Restag (ps) Skull Base To Thigh  05/11/2013   *RADIOLOGY REPORT*  Clinical Data: Subsequent treatment strategy for restaging of lung cancer.  NUCLEAR MEDICINE PET SKULL BASE TO THIGH  Fasting Blood Glucose:  88  Technique:  16.2 mCi F-18 FDG was injected intravenously. CT data was obtained and used for attenuation correction and anatomic localization only.  (This was not acquired as a diagnostic CT examination.) Additional exam technical data entered on technologist worksheet.  Comparison:  04/30/2013 chest CT.  Prior PET of 08/21/2012 from [facility].  Findings:  Neck: Development of a hypermetabolic left supraclavicular/lower jugular node.  This measures 1.0 cm and a S.U.V. max of 15.1 on image 53/series 2.  Chest:  Hypermetabolic right paratracheal adenopathy.  This measures 2.1 cm and a S.U.V. max of 13.1 on image 71/series 2.  On the prior PET, this had a maximum s u v of 11.4.  Hypermetabolic subcarinal and prevascular adenopathy as well.  Hypermetabolism corresponding to  borderline enlarged left hilar nodal tissue.  This measures a S.U.V. max of 7.6 on image 86/series 2.  This is new since the prior PET.  As detailed on the prior diagnostic CT, right infrahilar nodal tissue corresponds to hypermetabolism.  This measures a S.U.V. max of 15.1 on image 87/series 2.  There is diffuse right upper lobe and adjacent superior segment right lower lobe hypermetabolism, corresponding to airspace disease and septal thickening.  Primarily felt to be radiation induced. More focal area of right upper lobe hypermetabolism corresponds to residual nodular component which measures 2.0 cm and a S.U.V. max of 11.5 on image 71/series 2.  Abdomen/Pelvis:  No abnormal hypermetabolism.  Skeleton:  Spinous process hypermetabolism in the lower lumbar spine is favored to be degenerative.  CT  images performed for attenuation correction demonstrate no further findings within the neck.  Chest findings deferred to recent diagnostic CT.  A small right- sided pleural effusion is not significantly changed.  Right nephrolithiasis.  Densely calcified inferior mesenteric artery.  Old granulomatous disease in the spleen.  Radiation seeds in the prostate.  Probable hydroceles.  Mild osteopenia.  Left iliac bone island.  IMPRESSION:  1.  Since 08/21/2012, progression of metastatic disease with nodal metastasis in the low neck, bilateral hila, and bilateral mediastinal stations. 2.  Radiation change throughout the right lung.  Suspect residual nodule centrally in the right upper lobe. 3.  Small right pleural effusion, similar to most recent CT.   Original Report Authenticated By: Jeronimo Greaves, M.D.    ASSESSMENT AND PLAN: This is a very pleasant 67 years old African American male with now metastatic non-small cell lung cancer, adenocarcinoma. Is currently being treated with systemic chemotherapy in the form of carboplatin for an AUC of 5 and Alimta at 500 mg region squared given every 3 weeks, status post 2 cycles.  We  will proceed with cycle #3 of his systemic chemotherapy with carboplatin and Alimta however he will be dose reduced his carboplatin will be given at an AUC of 4 and is Alimta root will be reduced to 400 mg per meter squared given every 3 weeks.  He is to continue with weekly labs as scheduled. He will follow up with Dr, Arbutus Ped in 3 weeks prior to the start of cycle #4 of his systemic chemotherapy with carboplatin and Alimta with a restaging CT scan of the chest, abdomen and pelvis to reevaluate his disease.Marland Kitchen  Laural Benes, Darlys Buis E, PA-C   He was advised to call immediately if he has any concerning symptoms in the interval.  The patient voices understanding of current disease status and treatment options and is in agreement with the current care plan.  All questions were answered. The patient knows to call the clinic with any problems, questions or concerns. We can certainly see the patient much sooner if necessary.

## 2013-07-17 NOTE — Telephone Encounter (Signed)
Per staff message and POF I have scheduled appts.  JMW  

## 2013-07-17 NOTE — Telephone Encounter (Signed)
gv and pritned appt sched and avs for pt for OCT and NOV...MW added tx.

## 2013-07-17 NOTE — Patient Instructions (Signed)
[facility] Discharge Instructions for Patients Receiving Chemotherapy  Today you received the following chemotherapy agents Alimta and Carboplatin.  To help prevent nausea and vomiting after your treatment, we encourage you to take your nausea medication as prescribed.   If you develop nausea and vomiting that is not controlled by your nausea medication, call the clinic.   BELOW ARE SYMPTOMS THAT SHOULD BE REPORTED IMMEDIATELY:  *FEVER GREATER THAN 100.5 F  *CHILLS WITH OR WITHOUT FEVER  NAUSEA AND VOMITING THAT IS NOT CONTROLLED WITH YOUR NAUSEA MEDICATION  *UNUSUAL SHORTNESS OF BREATH  *UNUSUAL BRUISING OR BLEEDING  TENDERNESS IN MOUTH AND THROAT WITH OR WITHOUT PRESENCE OF ULCERS  *URINARY PROBLEMS  *BOWEL PROBLEMS  UNUSUAL RASH Items with * indicate a potential emergency and should be followed up as soon as possible.  Feel free to call the clinic you have any questions or concerns. The clinic phone number is [phone].    

## 2013-07-19 NOTE — Patient Instructions (Signed)
Continue weekly labs as scheduled Followup in 3 weeks of the restaging CT scan of your chest, abdomen and pelvis to reevaluate your disease

## 2013-07-24 ENCOUNTER — Encounter (INDEPENDENT_AMBULATORY_CARE_PROVIDER_SITE_OTHER): Payer: Self-pay

## 2013-07-24 ENCOUNTER — Non-Acute Institutional Stay (SKILLED_NURSING_FACILITY): Payer: PRIVATE HEALTH INSURANCE | Admitting: Internal Medicine

## 2013-07-24 ENCOUNTER — Other Ambulatory Visit (HOSPITAL_BASED_OUTPATIENT_CLINIC_OR_DEPARTMENT_OTHER): Payer: PRIVATE HEALTH INSURANCE

## 2013-07-24 DIAGNOSIS — C3410 Malignant neoplasm of upper lobe, unspecified bronchus or lung: Secondary | ICD-10-CM

## 2013-07-24 DIAGNOSIS — C3491 Malignant neoplasm of unspecified part of right bronchus or lung: Secondary | ICD-10-CM

## 2013-07-24 DIAGNOSIS — E7800 Pure hypercholesterolemia, unspecified: Secondary | ICD-10-CM

## 2013-07-24 DIAGNOSIS — G609 Hereditary and idiopathic neuropathy, unspecified: Secondary | ICD-10-CM

## 2013-07-24 DIAGNOSIS — J309 Allergic rhinitis, unspecified: Secondary | ICD-10-CM

## 2013-07-24 DIAGNOSIS — R569 Unspecified convulsions: Secondary | ICD-10-CM

## 2013-07-24 LAB — CBC WITH DIFFERENTIAL/PLATELET
BASO%: 1.1 % (ref 0.0–2.0)
EOS%: 1.4 % (ref 0.0–7.0)
HGB: 11.9 g/dL — ABNORMAL LOW (ref 13.0–17.1)
MCH: 32.1 pg (ref 27.2–33.4)
MCHC: 33.6 g/dL (ref 32.0–36.0)
MONO#: 0.3 10*3/uL (ref 0.1–0.9)
RDW: 16.2 % — ABNORMAL HIGH (ref 11.0–14.6)
WBC: 3.2 10*3/uL — ABNORMAL LOW (ref 4.0–10.3)
lymph#: 0.5 10*3/uL — ABNORMAL LOW (ref 0.9–3.3)

## 2013-07-24 LAB — COMPREHENSIVE METABOLIC PANEL (CC13)
ALT: 31 U/L (ref 0–55)
AST: 28 U/L (ref 5–34)
Albumin: 3 g/dL — ABNORMAL LOW (ref 3.5–5.0)
Anion Gap: 11 mEq/L (ref 3–11)
Calcium: 9.6 mg/dL (ref 8.4–10.4)
Chloride: 102 mEq/L (ref 98–109)
Potassium: 3.5 mEq/L (ref 3.5–5.1)
Total Protein: 8.8 g/dL — ABNORMAL HIGH (ref 6.4–8.3)

## 2013-07-25 NOTE — Progress Notes (Signed)
Patient ID: [name], male   DOB: 1946-04-20, 67 y.o.   MRN: [id number]        PROGRESS NOTE  DATE: 07/24/2013  FACILITY: Nursing Home Location: [facility]  LEVEL OF CARE: SNF (31)  Routine Visit  CHIEF COMPLAINT:  Manage seizure disorder, hyperlipidemia, and peripheral neuropathy.     HISTORY OF PRESENT ILLNESS:  REASSESSMENT OF ONGOING PROBLEM(S):  SEIZURE DISORDER: The patient's seizure disorder remains stable. No complications reported from the medications presently being used. Staff do not report any recent seizure activity.    HYPERLIPIDEMIA: No complications from the medications presently being used. Last fasting lipid panel showed :  In 01/2013:  Fasting lipid panel normal.    PERIPHERAL NEUROPATHY: The peripheral neuropathy is stable. The staff denies pain in the feet, tingling, and numbness. No complications noted from the medication presently being used.  PAST MEDICAL HISTORY : Reviewed.  No changes.  CURRENT MEDICATIONS: Reviewed per [facility]  REVIEW OF SYSTEMS:  Difficult to obtain.  Patient is a poor historian.    PHYSICAL EXAMINATION  VS:  T 97.5     P 66     RR 16    BP 114/60    POX %     WT (Lb) 130  GENERAL: no acute distress, normal body habitus EYES: conjunctivae normal, sclerae normal, normal eye lids NECK: supple, trachea midline, no neck masses, no thyroid tenderness, no thyromegaly LYMPHATICS: no LAN in the neck, no supraclavicular LAN RESPIRATORY: breathing is even & unlabored, BS CTAB CARDIAC: RRR, no murmur,no extra heart sounds, no edema GI: abdomen soft, normal BS, no masses, no tenderness, no hepatomegaly, no splenomegaly PSYCHIATRIC: the patient is alert & oriented to person, affect & behavior appropriate  LABS/RADIOLOGY: 8-14 Dilantin 6.7, albumin 3.6  01/2013:  Albumin 3, alkaline phosphatase 153, ALT 70, otherwise liver profile normal.    WBC 11, otherwise CBC normal.    Hemoglobin A1c 5.5.    BMP normal.     12/2012:  TSH 2.23.    11/2012:  Dilantin level 11.3.   ASSESSMENT/PLAN:  Seizure disorder.  Well controlled.    Hyperlipidemia.  Well controlled.    Peripheral neuropathy.  No complaints.    Allergic rhinitis.  Continue current medications.     Depression.  Continue Remeron.    Constipation.  Well controlled.    Lung cancer-on chemotherapy per her oncologist  CPT CODE: 40981

## 2013-07-26 ENCOUNTER — Telehealth: Payer: Self-pay | Admitting: *Deleted

## 2013-07-26 NOTE — Telephone Encounter (Signed)
Nurse from [facility] nursing facility "Harriette Bouillon " asked to fax upcoming scheule for patient at the cancer center, Informe her none with Radiation Oncology, but would fax his November schule for labs, CT , and MD in Medial Oncology to fax number [phone] 1:39 PM

## 2013-08-03 ENCOUNTER — Other Ambulatory Visit (HOSPITAL_BASED_OUTPATIENT_CLINIC_OR_DEPARTMENT_OTHER): Payer: PRIVATE HEALTH INSURANCE | Admitting: Lab

## 2013-08-03 ENCOUNTER — Ambulatory Visit (HOSPITAL_COMMUNITY)
Admission: RE | Admit: 2013-08-03 | Discharge: 2013-08-03 | Disposition: A | Discharge status: Home / Self Care | Payer: PRIVATE HEALTH INSURANCE | Source: Ambulatory Visit | Attending: Physician Assistant | Admitting: Physician Assistant

## 2013-08-03 DIAGNOSIS — C3491 Malignant neoplasm of unspecified part of right bronchus or lung: Secondary | ICD-10-CM

## 2013-08-03 DIAGNOSIS — J90 Pleural effusion, not elsewhere classified: Secondary | ICD-10-CM | POA: Insufficient documentation

## 2013-08-03 DIAGNOSIS — Z9221 Personal history of antineoplastic chemotherapy: Secondary | ICD-10-CM | POA: Insufficient documentation

## 2013-08-03 DIAGNOSIS — R599 Enlarged lymph nodes, unspecified: Secondary | ICD-10-CM | POA: Insufficient documentation

## 2013-08-03 DIAGNOSIS — C3490 Malignant neoplasm of unspecified part of unspecified bronchus or lung: Secondary | ICD-10-CM | POA: Insufficient documentation

## 2013-08-03 DIAGNOSIS — C3410 Malignant neoplasm of upper lobe, unspecified bronchus or lung: Secondary | ICD-10-CM

## 2013-08-03 LAB — COMPREHENSIVE METABOLIC PANEL (CC13)
ALT: 22 U/L (ref 0–55)
AST: 22 U/L (ref 5–34)
Alkaline Phosphatase: 109 U/L (ref 40–150)
Anion Gap: 11 mEq/L (ref 3–11)
CO2: 27 mEq/L (ref 22–29)
Calcium: 9.4 mg/dL (ref 8.4–10.4)
Chloride: 103 mEq/L (ref 98–109)
Creatinine: 0.8 mg/dL (ref 0.7–1.3)
Glucose: 89 mg/dl (ref 70–140)
Potassium: 4 mEq/L (ref 3.5–5.1)
Total Bilirubin: 0.29 mg/dL (ref 0.20–1.20)
Total Protein: 7.9 g/dL (ref 6.4–8.3)

## 2013-08-03 LAB — CBC WITH DIFFERENTIAL/PLATELET
BASO%: 0.3 % (ref 0.0–2.0)
Eosinophils Absolute: 0.1 10*3/uL (ref 0.0–0.5)
LYMPH%: 21.5 % (ref 14.0–49.0)
MCH: 32.5 pg (ref 27.2–33.4)
MCHC: 33.4 g/dL (ref 32.0–36.0)
MCV: 97.4 fL (ref 79.3–98.0)
MONO#: 0.4 10*3/uL (ref 0.1–0.9)
MONO%: 15.6 % — ABNORMAL HIGH (ref 0.0–14.0)
NEUT%: 59.5 % (ref 39.0–75.0)
RBC: 3.09 10*6/uL — ABNORMAL LOW (ref 4.20–5.82)
RDW: 17.3 % — ABNORMAL HIGH (ref 11.0–14.6)
WBC: 2.3 10*3/uL — ABNORMAL LOW (ref 4.0–10.3)
lymph#: 0.5 10*3/uL — ABNORMAL LOW (ref 0.9–3.3)

## 2013-08-03 MED ORDER — IOHEXOL 300 MG/ML  SOLN
80.0000 mL | Freq: Once | INTRAMUSCULAR | Status: AC | PRN
  Administered 2013-08-03: 80 mL via INTRAVENOUS

## 2013-08-03 MED ORDER — IOHEXOL 300 MG/ML  SOLN: 80.0000 mL | Freq: Once | INTRAMUSCULAR | Status: AC | PRN

## 2013-08-07 ENCOUNTER — Ambulatory Visit (HOSPITAL_BASED_OUTPATIENT_CLINIC_OR_DEPARTMENT_OTHER): Payer: PRIVATE HEALTH INSURANCE | Admitting: Physician Assistant

## 2013-08-07 ENCOUNTER — Encounter: Payer: Self-pay | Admitting: Physician Assistant

## 2013-08-07 ENCOUNTER — Encounter (INDEPENDENT_AMBULATORY_CARE_PROVIDER_SITE_OTHER): Payer: Self-pay

## 2013-08-07 ENCOUNTER — Other Ambulatory Visit: Payer: Self-pay | Admitting: *Deleted

## 2013-08-07 ENCOUNTER — Other Ambulatory Visit (HOSPITAL_BASED_OUTPATIENT_CLINIC_OR_DEPARTMENT_OTHER): Payer: PRIVATE HEALTH INSURANCE | Admitting: Lab

## 2013-08-07 ENCOUNTER — Telehealth: Payer: Self-pay | Admitting: Internal Medicine

## 2013-08-07 ENCOUNTER — Ambulatory Visit: Payer: PRIVATE HEALTH INSURANCE

## 2013-08-07 DIAGNOSIS — C3410 Malignant neoplasm of upper lobe, unspecified bronchus or lung: Secondary | ICD-10-CM

## 2013-08-07 DIAGNOSIS — C771 Secondary and unspecified malignant neoplasm of intrathoracic lymph nodes: Secondary | ICD-10-CM

## 2013-08-07 DIAGNOSIS — C3491 Malignant neoplasm of unspecified part of right bronchus or lung: Secondary | ICD-10-CM

## 2013-08-07 LAB — CBC WITH DIFFERENTIAL/PLATELET
BASO%: 0.3 % (ref 0.0–2.0)
Eosinophils Absolute: 0.1 10*3/uL (ref 0.0–0.5)
LYMPH%: 24.6 % (ref 14.0–49.0)
MCHC: 32 g/dL (ref 32.0–36.0)
MCV: 97.1 fL (ref 79.3–98.0)
MONO%: 13.9 % (ref 0.0–14.0)
NEUT#: 1.9 10*3/uL (ref 1.5–6.5)
Platelets: 76 10*3/uL — ABNORMAL LOW (ref 140–400)
RBC: 3.09 10*6/uL — ABNORMAL LOW (ref 4.20–5.82)
WBC: 3.2 10*3/uL — ABNORMAL LOW (ref 4.0–10.3)
nRBC: 0 % (ref 0–0)

## 2013-08-07 MED ORDER — LACOSAMIDE 100 MG PO TABS: ORAL_TABLET | ORAL | Status: DC

## 2013-08-07 NOTE — Progress Notes (Addendum)
[facility] [phone]   [phone]  SHARED VISIT PROGRESS NOTE  Miranda Garber E, PA-C [address] [facility] [place] [address]  DIAGNOSIS: Metastatic non-small cell lung cancer initially diagnosed as stage IIIA (T1a, N2, M0) non-small cell lung cancer, adenocarcinoma diagnosed in December of 2013.   PRIOR THERAPY: concurrent chemoradiation with weekly carboplatin for AUC of 2 and paclitaxel 45 mg/M2, status post 4 weekly doses of chemotherapy last dose was given on 11/06/2012 and last fraction of radiotherapy 11/07/2012.   CURRENT THERAPY: Systemic chemotherapy with carboplatin for AUC of 5 and Alimta 500 mg/M2 every 3 weeks. First dose given on 06/05/2013. Status post 3 cycles  CHEMOTHERAPY INTENT: Palliative  CURRENT # OF CHEMOTHERAPY CYCLES: 3  CURRENT ANTIEMETICS: Zofran, dexamethasone and Compazine  CURRENT SMOKING STATUS:  Former smoker  ORAL CHEMOTHERAPY AND CONSENT: None  CURRENT BISPHOSPHONATES USE: None  PAIN MANAGEMENT: 0/10  NARCOTICS INDUCED CONSTIPATION: None  LIVING WILL AND CODE STATUS: Full code   INTERVAL HISTORY: [name] 67 y.o. male returns to the clinic today for a symptom management visit accompanied by his caregiver from the skilled nursing facility. The patient is feeling fine today with no specific complaints.  He denied having any significant weight loss or night sweats. Overall he is tolerating his chemotherapy without difficulty.  He continues to have shortness breath with exertion with no cough or hemoptysis. He presents today to proceed with cycle #4 of his systemic chemotherapy with carboplatin and Alimta. He also recently had an a restaging CT scan of the chest to reevaluate his disease and is also here to discuss the results.  MEDICAL HISTORY: Past Medical History  Diagnosis Date  . Prostate cancer 04/22/09    Adenomacarcinoma,glleason=#=#=6,volume=32.9cc,PSA=5.91  .  Gout     hx  . Myocardial infarction   . Anxiety   . Depression   . PVD (peripheral vascular disease)   . Stroke     hx syndrome  . Seizures   . Hypertension   . Hypercholesterolemia   . History of radiation therapy 08/25/2009-10/28/2009    prostate 78 Gy/40 fractions  . Lung cancer 08/29/12    bx= right  upper outer lobe lung=Adenocarcinoma  . Anemia   . CAD (coronary artery disease)   . Gangrene     hx  . Hemiparesis     left , and contracture lue  . Sinus tachycardia     hx  . Seizure 10/03/2012  . CAD (coronary artery disease) 10/03/2012  . HTN (hypertension) 10/03/2012  . Stroke 10/03/2012  . Allergic rhinitis, cause unspecified 05/17/2013  . History of radiation therapy 10/25/12-12/14/12    lung 66Gy    ALLERGIES:  has No Known Allergies.  MEDICATIONS:  Current Outpatient Prescriptions  Medication Sig Dispense Refill  . acetaminophen (TYLENOL) 325 MG tablet Take 650 mg by mouth every 4 (four) hours as needed.      . Artificial Tear Ointment (AKWA TEARS) 0.5 % ophthalmic ointment Place into the right eye as needed.      Marland Kitchen aspirin 81 MG tablet Take 81 mg by mouth daily.      Marland Kitchen atorvastatin (LIPITOR) 20 MG tablet Take 20 mg by mouth daily.      . Cholecalciferol (VITAMIN D PO) Take by mouth daily. D 3-50 50000 units      . feeding supplement (RESOURCE BREEZE) LIQD Take 1 Container by mouth at bedtime.      . Fluticasone-Salmeterol (  ADVAIR) 100-50 MCG/DOSE AEPB Inhale 1 puff into the lungs every 12 (twelve) hours.      . folic acid (FOLVITE) 1 MG tablet Take 1 mg by mouth daily.      Marland Kitchen HYDROcodone-acetaminophen (NORCO/VICODIN) 5-325 MG per tablet Take one tablet by mouth every 4 hours as needed for pain or headache  180 tablet  0  . ipratropium-albuterol (DUONEB) 0.5-2.5 (3) MG/3ML SOLN Take 3 mLs by nebulization every 6 (six) hours as needed.      . Lacosamide (VIMPAT) 100 MG TABS Take 1 tablet twice a day  60 tablet  5  . loratadine (CLARITIN) 10 MG tablet Take 10 mg by mouth  daily.      . mirtazapine (REMERON SOL-TAB) 15 MG disintegrating tablet Take 15 mg by mouth at bedtime.      . Multiple Vitamins-Iron (MULTIVITAMINS WITH IRON) TABS tablet Take 1 tablet by mouth daily.      . nitroGLYCERIN (NITROSTAT) 0.4 MG SL tablet Place 0.4 mg under the tongue every 5 (five) minutes as needed.      . phenytoin (DILANTIN) 100 MG ER capsule Take 100 mg by mouth 2 (two) times daily. Take 2 capsules bid      . pregabalin (LYRICA) 150 MG capsule Take 150 mg by mouth daily.      . prochlorperazine (COMPAZINE) 10 MG tablet Take 1 tablet (10 mg total) by mouth every 6 (six) hours as needed.  60 tablet  0  . sennosides-docusate sodium (SENOKOT-S) 8.6-50 MG tablet Take 1 tablet by mouth as directed.      . vitamin C (ASCORBIC ACID) 500 MG tablet Take 500 mg by mouth daily.      . Alum & Mag Hydroxide-Simeth (MAGIC MOUTHWASH W/LIDOCAINE) SOLN Take 5 mLs by mouth 4 (four) times daily as needed.      Marland Kitchen dexamethasone (DECADRON) 4 MG tablet Take 4 mg by mouth 2 (two) times daily with a meal.      . famciclovir (FAMVIR) 500 MG tablet Take 1 tablet (500 mg total) by mouth 3 (three) times daily.  21 tablet  0  . FLORA-Q (FLORA-Q) CAPS capsule Take 1 capsule by mouth daily.      . hyaluronate sodium (RADIAPLEXRX) GEL Apply topically once.      . levETIRAcetam (KEPPRA) 1000 MG tablet Take 1,000 mg by mouth 2 (two) times daily.      Marland Kitchen lidocaine-prilocaine (EMLA) cream Apply topically as needed. Apply to port approx 1 hour before chemo appt, cover with tegaderm.  30 g  0  . Multiple Vitamin (MULTIVITAMIN WITH MINERALS) TABS Take 1 tablet by mouth daily.      . Vardenafil HCl (LEVITRA PO) Take by mouth.       No current facility-administered medications for this visit.    SURGICAL HISTORY:  Past Surgical History  Procedure Laterality Date  . 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 Lung biopsy  08/29/12    RUO lung=Adenocarcinoma  . Prostate biopsy  04/22/09     Adenocarcinoma    REVIEW OF SYSTEMS:  A comprehensive review of systems was negative except for: Constitutional: positive for fatigue Respiratory: positive for dyspnea on exertion   PHYSICAL EXAMINATION: General appearance: alert, cooperative, fatigued and no distress Head: Normocephalic, without obvious abnormality, atraumatic Neck: no adenopathy Lymph nodes: Cervical, supraclavicular, and axillary nodes normal. Resp: wheezes bilaterally Cardio: regular rate and rhythm, S1, S2 normal, no murmur, click, rub  or gallop GI: soft, non-tender; bowel sounds normal; no masses,  no organomegaly Extremities: Left AKA Neurologic: Grossly normal  ECOG PERFORMANCE STATUS: 2 - Symptomatic, <50% confined to bed  Blood pressure 97/60, pulse 88, temperature 97.6 F (36.4 C), temperature source Oral, resp. rate 18, height 5\' 8"  (1.727 m), weight 126 lb (57.153 kg).  LABORATORY DATA: Lab Results  Component Value Date   WBC 3.2* 08/07/2013   HGB 9.6* 08/07/2013   HCT 30.0* 08/07/2013   MCV 97.1 08/07/2013   PLT 76* 08/07/2013      Chemistry      Component Value Date/Time   NA 141 08/03/2013 1219   NA 140 03/10/2009 1220   K 4.0 08/03/2013 1219   K 4.2 03/10/2009 1220   CL 105 01/25/2013 0857   CL 109 03/10/2009 1220   CO2 27 08/03/2013 1219   CO2 26 03/10/2009 1220   BUN 9.9 08/03/2013 1219   BUN 13 03/10/2009 1220   CREATININE 0.8 08/03/2013 1219   CREATININE 0.88 03/10/2009 1220      Component Value Date/Time   CALCIUM 9.4 08/03/2013 1219   CALCIUM 8.8 03/10/2009 1220   ALKPHOS 109 08/03/2013 1219   ALKPHOS 57 10/25/2007 0545   AST 22 08/03/2013 1219   AST 97* 10/25/2007 0545   ALT 22 08/03/2013 1219   ALT 32 10/25/2007 0545   BILITOT 0.29 08/03/2013 1219   BILITOT 0.8 10/25/2007 0545       RADIOGRAPHIC STUDIES: Ct Chest W Contrast  08/03/2013   CLINICAL DATA:  Followup non-small cell lung carcinoma. Status post chemotherapy.  EXAM: CT CHEST WITH CONTRAST  TECHNIQUE: Multidetector CT imaging  of the chest was performed during intravenous contrast administration.  CONTRAST:  80mL OMNIPAQUE IOHEXOL 300 MG/ML  SOLN  COMPARISON:  Chest CT on 04/30/2013  FINDINGS: Progressive right upper lobe atelectasis and consolidation is seen the since previous study, which now obscures the previously seen spiculated nodule in the central right upper lobe.  Small to moderate right pleural effusion is increased in size. Increased size of the mass in the central right lower lobe is seen, currently measuring 1.5 x 2.4 cm on image 28 1.2 x 2.0 cm previously. Increased lymphadenopathy is also seen in the right hilum and subcarinal region. Sub carinal lymphadenopathy currently measures 2.1 x 2.5 cm on image 26 compared to 1.2 x 1.8 cm previously. Mediastinal lymphadenopathy in the high right paratracheal region is also mildly increased, currently measuring 12 mm on image 11 compared to 8 mm previously. Other areas of mediastinal lymphadenopathy remains stable.  Mild left lower lobe scarring is stable. No evidence of left lung mass or pleural effusion. No evidence of chest wall mass or suspicious bone lesions. The adrenal glands remain normal in appearance.  IMPRESSION: Mild increase in size of mass in the central right lower lobe, and increased right hilar and mediastinal lymphadenopathy.  Progressive right upper lobe atelectasis and consolidation, which now obscures the previously seen right upper lobe nodule.   Electronically Signed   By: Myles Rosenthal M.D.   On: 08/03/2013 14:51    ASSESSMENT AND PLAN: This is a very pleasant 67 years old [place] American male with now metastatic non-small cell lung cancer, adenocarcinoma. Is currently being treated with systemic chemotherapy in the form of carboplatin for an AUC of 5 and Alimta at 500 mg region squared given every 3 weeks, status post 3 cycles.  Patient was discussed with and also seen by Dr. Arbutus Ped. Unfortunately his CT scan  revealed evidence for disease progression with  enlargement of the mass in the central right lower lobe and increased right hilar and mediastinal lymphadenopathy. Additionally, his platelet count is subtherapeutic at 76,000. In view of his disease progression, his chemotherapy will be changed to systemic chemotherapy with carboplatin, Taxol and Avastin. We will schedule him to return in one week with repeat labs and will proceed with a first cycle of this changing chemotherapy as long as his labs are within therapeutic range. He'll then followup in 4 weeks prior to the start of cycle #2 symptom management visit. He will continue with weekly labs in the interim consisting of a CBC differential and C. met.   Laural Benes, Mikeisha Lemonds E, PA-C   He was advised to call immediately if he has any concerning symptoms in the interval.  The patient voices understanding of current disease status and treatment options and is in agreement with the current care plan.  All questions were answered. The patient knows to call the clinic with any problems, questions or concerns. We can certainly see the patient much sooner if necessary.  ADDENDUM: Hematology/Oncology Attending: I had a face to face encounter with the patient.. I recommended his care plan. The patient is feeling fine today with no specific complaints. He tolerated the last cycle of his systemic chemotherapy with carboplatin and Alimta fairly well. He has repeat CT scan of the chest, abdomen and pelvis performed recently which showed evidence for disease progression. I discussed the scan results with the patient today. I recommended for him to discontinue his current treatment with carboplatin and Alimta. I would consider the patient for a different chemotherapy regimen in the form of carboplatin for AUC of 5, paclitaxel and 75 mg/M2 and Avastin 15 mg/kilogram every 3 weeks. I also gave the patient the option of palliative care but he is interested in proceeding with active treatment. His platelets count are low  today. I will consider him to start the first cycle of his chemotherapy with the new regimen next week. I discussed with the patient adverse effect of the chemotherapy including but not limited to alopecia, myelosuppression, nausea and vomiting, peripheral neuropathy, liver or renal dysfunction. He would like to proceed with treatment as planned. He would come back for follow up visit in 4 weeks with the start of cycle #2 history of treatment. Lajuana Matte., MD 08/08/2013

## 2013-08-07 NOTE — Telephone Encounter (Signed)
gv and printed appt sched and avs for pt for NOV and DEC....emailed MW to add tx.

## 2013-08-08 ENCOUNTER — Telehealth: Payer: Self-pay | Admitting: *Deleted

## 2013-08-08 NOTE — Telephone Encounter (Signed)
Per staff message and POF I have scheduled appts. First available appt is 11/20, advised scheduler  JMW

## 2013-08-08 NOTE — Patient Instructions (Signed)
Return in one week for repeat labs and proceed with your new chemotherapy Continue weekly labs as scheduled Followup in 4 weeks for another symptom management visit

## 2013-08-10 ENCOUNTER — Other Ambulatory Visit: Payer: Self-pay | Admitting: *Deleted

## 2013-08-10 MED ORDER — PREGABALIN 150 MG PO CAPS: ORAL_CAPSULE | ORAL | Status: DC

## 2013-08-14 ENCOUNTER — Other Ambulatory Visit (HOSPITAL_BASED_OUTPATIENT_CLINIC_OR_DEPARTMENT_OTHER): Payer: PRIVATE HEALTH INSURANCE

## 2013-08-14 ENCOUNTER — Non-Acute Institutional Stay (SKILLED_NURSING_FACILITY): Payer: PRIVATE HEALTH INSURANCE | Admitting: Internal Medicine

## 2013-08-14 ENCOUNTER — Encounter: Payer: Self-pay | Admitting: Internal Medicine

## 2013-08-14 DIAGNOSIS — J309 Allergic rhinitis, unspecified: Secondary | ICD-10-CM

## 2013-08-14 DIAGNOSIS — C3410 Malignant neoplasm of upper lobe, unspecified bronchus or lung: Secondary | ICD-10-CM

## 2013-08-14 DIAGNOSIS — G609 Hereditary and idiopathic neuropathy, unspecified: Secondary | ICD-10-CM

## 2013-08-14 DIAGNOSIS — C3491 Malignant neoplasm of unspecified part of right bronchus or lung: Secondary | ICD-10-CM

## 2013-08-14 DIAGNOSIS — E7800 Pure hypercholesterolemia, unspecified: Secondary | ICD-10-CM

## 2013-08-14 DIAGNOSIS — R569 Unspecified convulsions: Secondary | ICD-10-CM

## 2013-08-14 LAB — COMPREHENSIVE METABOLIC PANEL (CC13)
ALT: 16 U/L (ref 0–55)
AST: 21 U/L (ref 5–34)
Albumin: 3 g/dL — ABNORMAL LOW (ref 3.5–5.0)
Alkaline Phosphatase: 120 U/L (ref 40–150)
BUN: 10.3 mg/dL (ref 7.0–26.0)
Calcium: 9.6 mg/dL (ref 8.4–10.4)
Chloride: 106 mEq/L (ref 98–109)
Creatinine: 0.9 mg/dL (ref 0.7–1.3)
Potassium: 4 mEq/L (ref 3.5–5.1)
Total Bilirubin: 0.23 mg/dL (ref 0.20–1.20)

## 2013-08-14 LAB — CBC WITH DIFFERENTIAL/PLATELET
BASO%: 0.6 % (ref 0.0–2.0)
Basophils Absolute: 0 10*3/uL (ref 0.0–0.1)
EOS%: 3 % (ref 0.0–7.0)
LYMPH%: 16.4 % (ref 14.0–49.0)
MCH: 33.3 pg (ref 27.2–33.4)
MCHC: 32.8 g/dL (ref 32.0–36.0)
NEUT#: 3.1 10*3/uL (ref 1.5–6.5)
RBC: 3.36 10*6/uL — ABNORMAL LOW (ref 4.20–5.82)
RDW: 22.3 % — ABNORMAL HIGH (ref 11.0–14.6)
lymph#: 0.7 10*3/uL — ABNORMAL LOW (ref 0.9–3.3)

## 2013-08-14 NOTE — Progress Notes (Signed)
Patient ID: [name], male   DOB: 1946-03-29, 67 y.o.   MRN: [id number]        PROGRESS NOTE  DATE: 08/14/2013  FACILITY: Nursing Home Location: [facility]  LEVEL OF CARE: SNF (31)  Routine Visit  CHIEF COMPLAINT:  Manage seizure disorder, hyperlipidemia, and peripheral neuropathy.     HISTORY OF PRESENT ILLNESS:  REASSESSMENT OF ONGOING PROBLEM(S):  SEIZURE DISORDER: The patient's seizure disorder remains stable. No complications reported from the medications presently being used. Staff do not report any recent seizure activity.    HYPERLIPIDEMIA: No complications from the medications presently being used. Last fasting lipid panel showed :  In 01/2013:  Fasting lipid panel normal.    PERIPHERAL NEUROPATHY: The peripheral neuropathy is stable. The staff denies pain in the feet, tingling, and numbness. No complications noted from the medication presently being used.  PAST MEDICAL HISTORY : Reviewed.  No changes.  CURRENT MEDICATIONS: Reviewed per [facility]  REVIEW OF SYSTEMS:  Difficult to obtain.  Patient is a poor historian.    PHYSICAL EXAMINATION  VS:  T 97.9    P 69     RR 18    BP 154/72    POX %     WT (Lb) 130  GENERAL: no acute distress, normal body habitus EYES: conjunctivae normal, sclerae normal, normal eye lids NECK: supple, trachea midline, no neck masses, no thyroid tenderness, no thyromegaly LYMPHATICS: no LAN in the neck, no supraclavicular LAN RESPIRATORY: breathing is even & unlabored, BS CTAB CARDIAC: RRR, no murmur,no extra heart sounds, no edema GI: abdomen soft, normal BS, no masses, no tenderness, no hepatomegaly, no splenomegaly PSYCHIATRIC: the patient is alert & oriented to person, affect & behavior appropriate  LABS/RADIOLOGY:  11-14 albumin 3, Dilantin level 9.1 8-14 Dilantin 6.7, albumin 3.6  01/2013:  Albumin 3, alkaline phosphatase 153, ALT 70, otherwise liver profile normal.    WBC 11, otherwise CBC normal.     Hemoglobin A1c 5.5.    BMP normal.    12/2012:  TSH 2.23.    11/2012:  Dilantin level 11.3.   ASSESSMENT/PLAN:  Seizure disorder.  Well controlled.    Hyperlipidemia.  Well controlled.  Check fasting lipid panel  Peripheral neuropathy.  No complaints.    Allergic rhinitis.  Continue current medications.     Depression.  Continue Remeron.    Constipation.  Well controlled.    Lung cancer-on chemotherapy per her oncologist  Check CBC and CMP  CPT CODE: 47425

## 2013-08-16 ENCOUNTER — Ambulatory Visit: Payer: PRIVATE HEALTH INSURANCE

## 2013-08-21 ENCOUNTER — Other Ambulatory Visit (HOSPITAL_BASED_OUTPATIENT_CLINIC_OR_DEPARTMENT_OTHER): Payer: PRIVATE HEALTH INSURANCE

## 2013-08-21 DIAGNOSIS — C3491 Malignant neoplasm of unspecified part of right bronchus or lung: Secondary | ICD-10-CM

## 2013-08-21 DIAGNOSIS — C3490 Malignant neoplasm of unspecified part of unspecified bronchus or lung: Secondary | ICD-10-CM

## 2013-08-21 LAB — CBC WITH DIFFERENTIAL/PLATELET
BASO%: 0.7 % (ref 0.0–2.0)
Basophils Absolute: 0 10*3/uL (ref 0.0–0.1)
EOS%: 4 % (ref 0.0–7.0)
HGB: 11.3 g/dL — ABNORMAL LOW (ref 13.0–17.1)
LYMPH%: 15.5 % (ref 14.0–49.0)
MCH: 33.7 pg — ABNORMAL HIGH (ref 27.2–33.4)
MCV: 102.7 fL — ABNORMAL HIGH (ref 79.3–98.0)
MONO%: 11.7 % (ref 0.0–14.0)
NEUT%: 68.1 % (ref 39.0–75.0)
Platelets: 156 10*3/uL (ref 140–400)
RDW: 21.6 % — ABNORMAL HIGH (ref 11.0–14.6)
WBC: 4.5 10*3/uL (ref 4.0–10.3)

## 2013-08-21 LAB — COMPREHENSIVE METABOLIC PANEL (CC13)
AST: 24 U/L (ref 5–34)
Albumin: 3.1 g/dL — ABNORMAL LOW (ref 3.5–5.0)
Alkaline Phosphatase: 118 U/L (ref 40–150)
BUN: 8.2 mg/dL (ref 7.0–26.0)
Calcium: 9 mg/dL (ref 8.4–10.4)
Chloride: 102 mEq/L (ref 98–109)
Creatinine: 0.8 mg/dL (ref 0.7–1.3)
Potassium: 4.2 mEq/L (ref 3.5–5.1)
Total Bilirubin: 0.35 mg/dL (ref 0.20–1.20)
Total Protein: 8 g/dL (ref 6.4–8.3)

## 2013-08-28 ENCOUNTER — Other Ambulatory Visit: Payer: PRIVATE HEALTH INSURANCE

## 2013-09-04 ENCOUNTER — Ambulatory Visit: Payer: PRIVATE HEALTH INSURANCE

## 2013-09-04 ENCOUNTER — Non-Acute Institutional Stay (SKILLED_NURSING_FACILITY): Payer: PRIVATE HEALTH INSURANCE | Admitting: Internal Medicine

## 2013-09-04 ENCOUNTER — Ambulatory Visit: Payer: PRIVATE HEALTH INSURANCE | Admitting: Internal Medicine

## 2013-09-04 ENCOUNTER — Encounter: Payer: Self-pay | Admitting: Internal Medicine

## 2013-09-04 ENCOUNTER — Other Ambulatory Visit: Payer: PRIVATE HEALTH INSURANCE | Admitting: Lab

## 2013-09-04 DIAGNOSIS — J309 Allergic rhinitis, unspecified: Secondary | ICD-10-CM

## 2013-09-04 DIAGNOSIS — G609 Hereditary and idiopathic neuropathy, unspecified: Secondary | ICD-10-CM

## 2013-09-04 DIAGNOSIS — E7800 Pure hypercholesterolemia, unspecified: Secondary | ICD-10-CM

## 2013-09-04 DIAGNOSIS — R569 Unspecified convulsions: Secondary | ICD-10-CM

## 2013-09-04 NOTE — Progress Notes (Signed)
Patient ID: [name], male   DOB: [birth year], 67 y.o.   MRN: [id number]        PROGRESS NOTE  DATE: 09/04/2013  FACILITY: Nursing Home Location: [facility]  LEVEL OF CARE: SNF (31)  Routine Visit  CHIEF COMPLAINT:  Manage seizure disorder, hyperlipidemia, and peripheral neuropathy.     HISTORY OF PRESENT ILLNESS:  REASSESSMENT OF ONGOING PROBLEM(S):  SEIZURE DISORDER: The patient's seizure disorder remains stable. No complications reported from the medications presently being used. Staff do not report any recent seizure activity.    HYPERLIPIDEMIA: No complications from the medications presently being used. Last fasting lipid panel showed :  In 01/2013:  Fasting lipid panel normal,  In 11- 14 fasting lipid panel normal    PERIPHERAL NEUROPATHY: The peripheral neuropathy is stable. The staff denies pain in the feet, tingling, and numbness. No complications noted from the medication presently being used.  PAST MEDICAL HISTORY : Reviewed.  No changes.  CURRENT MEDICATIONS: Reviewed per [facility]  REVIEW OF SYSTEMS:  Difficult to obtain.  Patient is a poor historian.    PHYSICAL EXAMINATION  VS:  T 97.2    P 92     RR 19    BP 152/74    POX %     WT (Lb) 135  GENERAL: no acute distress, normal body habitus EYES: conjunctivae normal, sclerae normal, normal eye lids NECK: supple, trachea midline, no neck masses, no thyroid tenderness, no thyromegaly LYMPHATICS: no LAN in the neck, no supraclavicular LAN RESPIRATORY: breathing is even & unlabored, BS CTAB CARDIAC: RRR, no murmur,no extra heart sounds, no edema GI: abdomen soft, normal BS, no masses, no tenderness, no hepatomegaly, no splenomegaly PSYCHIATRIC: the patient is alert & oriented to person, affect & behavior appropriate  LABS/RADIOLOGY:  11-14 albumin 3, Dilantin level 9.1, hemoglobin 10.5, MCV 98, platelets 124, WBC 4.3, albumin 3.2 otherwise CMP normal  8-14 Dilantin 6.7, albumin 3.6  01/2013:   Albumin 3, alkaline phosphatase 153, ALT 70, otherwise liver profile normal.    WBC 11, otherwise CBC normal.    Hemoglobin A1c 5.5.    BMP normal.    12/2012:  TSH 2.23.    11/2012:  Dilantin level 11.3.   ASSESSMENT/PLAN:  Seizure disorder.  Well controlled.    Hyperlipidemia.  Well controlled.    Peripheral neuropathy.  No complaints.    Allergic rhinitis.  Continue current medications.     Depression.  Continue Remeron.    Constipation.  Well controlled.    Lung cancer-on chemotherapy per her oncologist  CPT CODE: 62130

## 2013-10-09 ENCOUNTER — Emergency Department (HOSPITAL_COMMUNITY): Payer: PRIVATE HEALTH INSURANCE

## 2013-10-09 ENCOUNTER — Inpatient Hospital Stay (HOSPITAL_COMMUNITY)
Admission: EM | Admit: 2013-10-09 | Discharge: 2013-10-17 | DRG: 180 | Disposition: A | Discharge status: Hospice | Payer: PRIVATE HEALTH INSURANCE | Attending: Internal Medicine | Admitting: Internal Medicine

## 2013-10-09 ENCOUNTER — Encounter (HOSPITAL_COMMUNITY): Payer: Self-pay | Admitting: Emergency Medicine

## 2013-10-09 DIAGNOSIS — Z823 Family history of stroke: Secondary | ICD-10-CM

## 2013-10-09 DIAGNOSIS — Z87891 Personal history of nicotine dependence: Secondary | ICD-10-CM

## 2013-10-09 DIAGNOSIS — Z515 Encounter for palliative care: Secondary | ICD-10-CM

## 2013-10-09 DIAGNOSIS — F05 Delirium due to known physiological condition: Secondary | ICD-10-CM | POA: Diagnosis not present

## 2013-10-09 DIAGNOSIS — I739 Peripheral vascular disease, unspecified: Secondary | ICD-10-CM | POA: Diagnosis present

## 2013-10-09 DIAGNOSIS — S78119A Complete traumatic amputation at level between unspecified hip and knee, initial encounter: Secondary | ICD-10-CM

## 2013-10-09 DIAGNOSIS — R1310 Dysphagia, unspecified: Secondary | ICD-10-CM | POA: Diagnosis present

## 2013-10-09 DIAGNOSIS — F3289 Other specified depressive episodes: Secondary | ICD-10-CM | POA: Diagnosis present

## 2013-10-09 DIAGNOSIS — I10 Essential (primary) hypertension: Secondary | ICD-10-CM

## 2013-10-09 DIAGNOSIS — IMO0002 Reserved for concepts with insufficient information to code with codable children: Secondary | ICD-10-CM | POA: Diagnosis not present

## 2013-10-09 DIAGNOSIS — F329 Major depressive disorder, single episode, unspecified: Secondary | ICD-10-CM | POA: Diagnosis present

## 2013-10-09 DIAGNOSIS — I252 Old myocardial infarction: Secondary | ICD-10-CM

## 2013-10-09 DIAGNOSIS — R0902 Hypoxemia: Secondary | ICD-10-CM | POA: Diagnosis present

## 2013-10-09 DIAGNOSIS — I959 Hypotension, unspecified: Secondary | ICD-10-CM | POA: Diagnosis not present

## 2013-10-09 DIAGNOSIS — J90 Pleural effusion, not elsewhere classified: Secondary | ICD-10-CM

## 2013-10-09 DIAGNOSIS — J189 Pneumonia, unspecified organism: Secondary | ICD-10-CM

## 2013-10-09 DIAGNOSIS — E7800 Pure hypercholesterolemia, unspecified: Secondary | ICD-10-CM | POA: Diagnosis present

## 2013-10-09 DIAGNOSIS — C3490 Malignant neoplasm of unspecified part of unspecified bronchus or lung: Secondary | ICD-10-CM

## 2013-10-09 DIAGNOSIS — Z79899 Other long term (current) drug therapy: Secondary | ICD-10-CM

## 2013-10-09 DIAGNOSIS — Z85118 Personal history of other malignant neoplasm of bronchus and lung: Secondary | ICD-10-CM

## 2013-10-09 DIAGNOSIS — J910 Malignant pleural effusion: Principal | ICD-10-CM

## 2013-10-09 DIAGNOSIS — Z8249 Family history of ischemic heart disease and other diseases of the circulatory system: Secondary | ICD-10-CM

## 2013-10-09 DIAGNOSIS — C3410 Malignant neoplasm of upper lobe, unspecified bronchus or lung: Secondary | ICD-10-CM

## 2013-10-09 DIAGNOSIS — G40909 Epilepsy, unspecified, not intractable, without status epilepticus: Secondary | ICD-10-CM

## 2013-10-09 DIAGNOSIS — Z66 Do not resuscitate: Secondary | ICD-10-CM

## 2013-10-09 DIAGNOSIS — F411 Generalized anxiety disorder: Secondary | ICD-10-CM | POA: Diagnosis present

## 2013-10-09 DIAGNOSIS — Z923 Personal history of irradiation: Secondary | ICD-10-CM

## 2013-10-09 DIAGNOSIS — Z8546 Personal history of malignant neoplasm of prostate: Secondary | ICD-10-CM

## 2013-10-09 DIAGNOSIS — R569 Unspecified convulsions: Secondary | ICD-10-CM

## 2013-10-09 DIAGNOSIS — C7800 Secondary malignant neoplasm of unspecified lung: Secondary | ICD-10-CM

## 2013-10-09 DIAGNOSIS — I2510 Atherosclerotic heart disease of native coronary artery without angina pectoris: Secondary | ICD-10-CM

## 2013-10-09 DIAGNOSIS — M109 Gout, unspecified: Secondary | ICD-10-CM | POA: Diagnosis present

## 2013-10-09 DIAGNOSIS — Z8673 Personal history of transient ischemic attack (TIA), and cerebral infarction without residual deficits: Secondary | ICD-10-CM

## 2013-10-09 LAB — INFLUENZA PANEL BY PCR (TYPE A & B)
H1N1 flu by pcr: NOT DETECTED
Influenza A By PCR: NEGATIVE
Influenza B By PCR: NEGATIVE

## 2013-10-09 LAB — BASIC METABOLIC PANEL
BUN: 9 mg/dL (ref 6–23)
CO2: 25 mEq/L (ref 19–32)
Calcium: 8.3 mg/dL — ABNORMAL LOW (ref 8.4–10.5)
Chloride: 102 mEq/L (ref 96–112)
Creatinine, Ser: 0.72 mg/dL (ref 0.50–1.35)
GFR calc Af Amer: >90 mL/min (ref >90)
GFR calc non Af Amer: >90 mL/min (ref >90)
Glucose, Bld: 101 mg/dL — ABNORMAL HIGH (ref 70–99)
Potassium: 5.4 mEq/L — ABNORMAL HIGH (ref 3.7–5.3)
Sodium: 138 mEq/L (ref 137–147)

## 2013-10-09 LAB — CBC WITH DIFFERENTIAL/PLATELET
Basophils Absolute: 0 10*3/uL (ref 0.0–0.1)
Basophils Relative: 0 % (ref 0–1)
Eosinophils Absolute: 0.2 10*3/uL (ref 0.0–0.7)
Eosinophils Relative: 2 % (ref 0–5)
HCT: 38.6 % — ABNORMAL LOW (ref 39.0–52.0)
Hemoglobin: 13.2 g/dL (ref 13.0–17.0)
Lymphocytes Relative: 12 % (ref 12–46)
Lymphs Abs: 0.9 10*3/uL (ref 0.7–4.0)
MCH: 34.6 pg — ABNORMAL HIGH (ref 26.0–34.0)
MCHC: 34.2 g/dL (ref 30.0–36.0)
MCV: 101.3 fL — ABNORMAL HIGH (ref 78.0–100.0)
Monocytes Absolute: 0.7 10*3/uL (ref 0.1–1.0)
Monocytes Relative: 10 % (ref 3–12)
Neutro Abs: 5.5 10*3/uL (ref 1.7–7.7)
Neutrophils Relative %: 75 % (ref 43–77)
Platelets: 126 10*3/uL — ABNORMAL LOW (ref 150–400)
RBC: 3.81 MIL/uL — ABNORMAL LOW (ref 4.22–5.81)
RDW: 12.5 % (ref 11.5–15.5)
WBC: 7.2 10*3/uL (ref 4.0–10.5)

## 2013-10-09 LAB — PRO B NATRIURETIC PEPTIDE: Pro B Natriuretic peptide (BNP): 234.6 pg/mL — ABNORMAL HIGH (ref 0–125)

## 2013-10-09 LAB — URINALYSIS, ROUTINE W REFLEX MICROSCOPIC
Bilirubin Urine: NEGATIVE
Glucose, UA: NEGATIVE mg/dL
Hgb urine dipstick: NEGATIVE
Ketones, ur: 15 mg/dL — AB
Leukocytes, UA: NEGATIVE
Nitrite: NEGATIVE
Protein, ur: NEGATIVE mg/dL
Specific Gravity, Urine: 1.02 (ref 1.005–1.030)
Urobilinogen, UA: 0.2 mg/dL (ref 0.0–1.0)
pH: 5.5 (ref 5.0–8.0)

## 2013-10-09 MED ORDER — ONDANSETRON HCL 4 MG/2ML IJ SOLN: 4.0000 mg | Freq: Four times a day (QID) | INTRAMUSCULAR | Status: DC | PRN

## 2013-10-09 MED ORDER — IPRATROPIUM BROMIDE 0.02 % IN SOLN
0.5000 mg | Freq: Four times a day (QID) | RESPIRATORY_TRACT | Status: DC
  Administered 2013-10-09 – 2013-10-10 (×3): 0.5 mg via RESPIRATORY_TRACT
  Filled 2013-10-09 (×4): qty 2.5

## 2013-10-09 MED ORDER — ACETAMINOPHEN 325 MG PO TABS
650.0000 mg | ORAL_TABLET | Freq: Four times a day (QID) | ORAL | Status: DC | PRN
  Administered 2013-10-11 – 2013-10-12 (×3): 650 mg via ORAL
  Filled 2013-10-09 (×3): qty 2

## 2013-10-09 MED ORDER — CHOLECALCIFEROL 1.25 MG (50000 UT) PO CAPS: 50000.0000 [IU] | ORAL_CAPSULE | ORAL | Status: DC

## 2013-10-09 MED ORDER — ACETAMINOPHEN 650 MG RE SUPP: 650.0000 mg | Freq: Four times a day (QID) | RECTAL | Status: DC | PRN

## 2013-10-09 MED ORDER — LEVETIRACETAM 500 MG PO TABS
1000.0000 mg | ORAL_TABLET | Freq: Two times a day (BID) | ORAL | Status: DC
  Administered 2013-10-10 – 2013-10-14 (×10): 1000 mg via ORAL
  Filled 2013-10-09 (×11): qty 2

## 2013-10-09 MED ORDER — MOMETASONE FURO-FORMOTEROL FUM 100-5 MCG/ACT IN AERO
2.0000 | INHALATION_SPRAY | Freq: Two times a day (BID) | RESPIRATORY_TRACT | Status: DC
  Administered 2013-10-09 – 2013-10-15 (×10): 2 via RESPIRATORY_TRACT
  Filled 2013-10-09 (×2): qty 8.8

## 2013-10-09 MED ORDER — DEXTROSE 5 % IV SOLN
1.0000 g | Freq: Two times a day (BID) | INTRAVENOUS | Status: DC
  Filled 2013-10-09: qty 1

## 2013-10-09 MED ORDER — OXYCODONE HCL 5 MG PO TABS: 5.0000 mg | ORAL_TABLET | ORAL | Status: DC | PRN

## 2013-10-09 MED ORDER — NITROGLYCERIN 0.4 MG SL SUBL: 0.4000 mg | SUBLINGUAL_TABLET | SUBLINGUAL | Status: DC | PRN

## 2013-10-09 MED ORDER — SODIUM CHLORIDE 0.45 % IV SOLN
INTRAVENOUS | Status: DC
  Administered 2013-10-09 – 2013-10-14 (×5): via INTRAVENOUS

## 2013-10-09 MED ORDER — AKWA TEARS 0.5 % OP OINT: 1.0000 [drp] | TOPICAL_OINTMENT | OPHTHALMIC | Status: DC | PRN

## 2013-10-09 MED ORDER — SENNOSIDES-DOCUSATE SODIUM 8.6-50 MG PO TABS
1.0000 | ORAL_TABLET | Freq: Every day | ORAL | Status: DC
  Administered 2013-10-10 – 2013-10-17 (×7): 1 via ORAL
  Filled 2013-10-09 (×8): qty 1

## 2013-10-09 MED ORDER — PREGABALIN 50 MG PO CAPS
150.0000 mg | ORAL_CAPSULE | Freq: Every day | ORAL | Status: DC
  Administered 2013-10-10 – 2013-10-14 (×5): 150 mg via ORAL
  Filled 2013-10-09 (×6): qty 3

## 2013-10-09 MED ORDER — VANCOMYCIN HCL IN DEXTROSE 1-5 GM/200ML-% IV SOLN
1000.0000 mg | Freq: Once | INTRAVENOUS | Status: AC
  Administered 2013-10-09: 1000 mg via INTRAVENOUS
  Filled 2013-10-09: qty 200

## 2013-10-09 MED ORDER — VITAMIN D (ERGOCALCIFEROL) 1.25 MG (50000 UNIT) PO CAPS: 50000.0000 [IU] | ORAL_CAPSULE | ORAL | Status: DC

## 2013-10-09 MED ORDER — CEFEPIME HCL 1 G IJ SOLR
1.0000 g | Freq: Three times a day (TID) | INTRAMUSCULAR | Status: DC
  Administered 2013-10-10 – 2013-10-15 (×16): 1 g via INTRAVENOUS
  Filled 2013-10-09 (×20): qty 1

## 2013-10-09 MED ORDER — POLYVINYL ALCOHOL 1.4 % OP SOLN
1.0000 [drp] | OPHTHALMIC | Status: DC | PRN
  Filled 2013-10-09: qty 15

## 2013-10-09 MED ORDER — HEPARIN SODIUM (PORCINE) 5000 UNIT/ML IJ SOLN
5000.0000 [IU] | Freq: Three times a day (TID) | INTRAMUSCULAR | Status: DC
  Administered 2013-10-10 – 2013-10-15 (×16): 5000 [IU] via SUBCUTANEOUS
  Filled 2013-10-09 (×19): qty 1

## 2013-10-09 MED ORDER — ALBUTEROL SULFATE (2.5 MG/3ML) 0.083% IN NEBU
2.5000 mg | INHALATION_SOLUTION | Freq: Four times a day (QID) | RESPIRATORY_TRACT | Status: DC
  Administered 2013-10-09 – 2013-10-10 (×3): 2.5 mg via RESPIRATORY_TRACT
  Filled 2013-10-09 (×4): qty 3

## 2013-10-09 MED ORDER — ONDANSETRON HCL 4 MG PO TABS: 4.0000 mg | ORAL_TABLET | Freq: Four times a day (QID) | ORAL | Status: DC | PRN

## 2013-10-09 MED ORDER — ATORVASTATIN CALCIUM 20 MG PO TABS
20.0000 mg | ORAL_TABLET | Freq: Every day | ORAL | Status: DC
  Administered 2013-10-10 – 2013-10-13 (×5): 20 mg via ORAL
  Filled 2013-10-09 (×7): qty 1

## 2013-10-09 MED ORDER — PHENYTOIN SODIUM EXTENDED 100 MG PO CAPS
200.0000 mg | ORAL_CAPSULE | Freq: Two times a day (BID) | ORAL | Status: DC
  Administered 2013-10-10 – 2013-10-14 (×10): 200 mg via ORAL
  Filled 2013-10-09 (×11): qty 2

## 2013-10-09 MED ORDER — LACOSAMIDE 200 MG PO TABS
100.0000 mg | ORAL_TABLET | Freq: Two times a day (BID) | ORAL | Status: DC
  Administered 2013-10-10 – 2013-10-17 (×14): 100 mg via ORAL
  Filled 2013-10-09 (×9): qty 1
  Filled 2013-10-09: qty 2
  Filled 2013-10-09 (×4): qty 1

## 2013-10-09 MED ORDER — FOLIC ACID 1 MG PO TABS
1.0000 mg | ORAL_TABLET | Freq: Every day | ORAL | Status: DC
  Administered 2013-10-10 – 2013-10-14 (×5): 1 mg via ORAL
  Filled 2013-10-09 (×6): qty 1

## 2013-10-09 MED ORDER — VANCOMYCIN HCL IN DEXTROSE 750-5 MG/150ML-% IV SOLN
750.0000 mg | Freq: Two times a day (BID) | INTRAVENOUS | Status: DC
  Administered 2013-10-10 – 2013-10-15 (×11): 750 mg via INTRAVENOUS
  Filled 2013-10-09 (×12): qty 150

## 2013-10-09 MED ORDER — ALBUTEROL SULFATE (2.5 MG/3ML) 0.083% IN NEBU: 2.5000 mg | INHALATION_SOLUTION | RESPIRATORY_TRACT | Status: DC | PRN

## 2013-10-09 MED ORDER — MIRTAZAPINE 15 MG PO TBDP
15.0000 mg | ORAL_TABLET | Freq: Every day | ORAL | Status: DC
  Administered 2013-10-10 – 2013-10-17 (×7): 15 mg via ORAL
  Filled 2013-10-09 (×10): qty 1

## 2013-10-09 MED ORDER — LORATADINE 10 MG PO TABS
10.0000 mg | ORAL_TABLET | Freq: Every day | ORAL | Status: DC
  Administered 2013-10-10 – 2013-10-14 (×5): 10 mg via ORAL
  Filled 2013-10-09 (×6): qty 1

## 2013-10-09 NOTE — Progress Notes (Signed)
ANTIBIOTIC CONSULT NOTE - INITIAL  Pharmacy Consult for vancomycin Indication: HCAP  No Known Allergies  Patient Measurements: Height: 5' 8.11" (173 cm) Weight: 126 lb 1.7 oz (57.2 kg) IBW/kg (Calculated) : 68.65  Vital Signs: Temp: 100.3 F (37.9 C) (01/13 1718) Temp src: Rectal (01/13 1712) BP: 98/51 mmHg (01/13 1930) Pulse Rate: 108 (01/13 1915) Intake/Output from previous day:   Intake/Output from this shift:    Labs:  Recent Labs  10/09/13 1645  WBC 7.2  HGB 13.2  PLT 126*  CREATININE 0.72   Estimated Creatinine Clearance: 71.5 ml/min (by C-G formula based on Cr of 0.72). No results found for this basename: VANCOTROUGH, VANCOPEAK, VANCORANDOM, GENTTROUGH, GENTPEAK, GENTRANDOM, TOBRATROUGH, TOBRAPEAK, TOBRARND, AMIKACINPEAK, AMIKACINTROU, AMIKACIN,  in the last 72 hours   Microbiology: No results found for this or any previous visit (from the past 720 hour(s)).  Medical History: Past Medical History  Diagnosis Date  . Prostate cancer 04/22/09    Adenomacarcinoma,glleason=#=#=6,volume=32.9cc,PSA=5.91  . Gout     hx  . Myocardial infarction   . Anxiety   . Depression   . PVD (peripheral vascular disease)   . Stroke     hx syndrome  . Seizures   . Hypertension   . Hypercholesterolemia   . History of radiation therapy 08/25/2009-10/28/2009    prostate 78 Gy/40 fractions  . Lung cancer 08/29/12    bx= right  upper outer lobe lung=Adenocarcinoma  . Anemia   . CAD (coronary artery disease)   . Gangrene     hx  . Hemiparesis     left , and contracture lue  . Sinus tachycardia     hx  . Seizure 10/03/2012  . CAD (coronary artery disease) 10/03/2012  . HTN (hypertension) 10/03/2012  . Stroke 10/03/2012  . Allergic rhinitis, cause unspecified 05/17/2013  . History of radiation therapy 10/25/12-12/14/12    lung 66Gy    Medications:  See home med list Assessment: 68 year old man who resides in a nursing home.  He has a history of lung cancer.  He is to be  admitted to the hospital for management of HCAP vs. Worsening lung cancer.  Vancomycin and cefepime to start per HCAP protocol Goal of Therapy:  Vancomycin trough level 15-20 mcg/ml  Plan:  Continue cefepime 1g IV q8h. Vancomycin 1g IV x 1 dose, then 750mg  IV q12. Follow culture results and renal function.   Will monitor a vancomycin trough if renal function changes or if therapy is to continue for at least 5 days.  Candie Mile 10/09/2013,8:17 PM

## 2013-10-09 NOTE — Progress Notes (Signed)
Unit CM UR Completed by [facility] ED CM  W. Tuyen Uncapher RN  

## 2013-10-09 NOTE — H&P (Signed)
[facility]                                                                                    Patient Demographics  [name], is a 68 y.o. male  CSN: [id number]  MRN: [id number]  DOB - 07-06-1946  Admit Date - 10/09/2013  Outpatient Primary MD for the patient is No primary provider on file.   With History of -  Past Medical History  Diagnosis Date  . Prostate cancer 04/22/09    Adenomacarcinoma,glleason=#=#=6,volume=32.9cc,PSA=5.91  . Gout     hx  . Myocardial infarction   . Anxiety   . Depression   . PVD (peripheral vascular disease)   . Stroke     hx syndrome  . Seizures   . Hypertension   . Hypercholesterolemia   . History of radiation therapy 08/25/2009-10/28/2009    prostate 78 Gy/40 fractions  . Lung cancer 08/29/12    bx= right  upper outer lobe lung=Adenocarcinoma  . Anemia   . CAD (coronary artery disease)   . Gangrene     hx  . Hemiparesis     left , and contracture lue  . Sinus tachycardia     hx  . Seizure 10/03/2012  . CAD (coronary artery disease) 10/03/2012  . HTN (hypertension) 10/03/2012  . Stroke 10/03/2012  . Allergic rhinitis, cause unspecified 05/17/2013  . History of radiation therapy 10/25/12-12/14/12    lung 66Gy      Past Surgical History  Procedure Laterality Date  . 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 Lung biopsy  08/29/12    RUO lung=Adenocarcinoma  . Prostate biopsy  04/22/09    Adenocarcinoma    in for   Chief Complaint  Patient presents with  . Pleural Effusion     HPI  [name]  is a 68 y.o. male, nursing home resident, with history of non-small lung cancer who was sent here for evaluation of hypoxemia noted in the nursing home. Chest x-ray showed pleural effusion and the patient was found to be febrile. I was called to admit the patient. Patient has advanced dementia and could not give any history.    Review of Systems   Unable to obtain due to patient's  mental status ISocial History History  Substance Use Topics  . Smoking status: Former Smoker    Quit date: 05/28/2012  . Smokeless tobacco: Not on file  . Alcohol Use: No     Family History Family History  Problem Relation Age of Onset  . Hypertension Mother   . Stroke Father   . Cancer Neg Hx      Prior to Admission medications   Medication Sig Start Date End Date Taking? Authorizing Provider  acetaminophen (TYLENOL) 325 MG tablet Take 650 mg by mouth every 4 (four) hours as needed for moderate pain.    Yes Historical Provider, MD  Artificial Tear Ointment (AKWA TEARS) 0.5 % ophthalmic ointment Place 1 drop into the right eye as needed (for dry eyes).    Yes Historical Provider, MD  aspirin 81 MG tablet Take 81 mg by mouth daily.  Yes Historical Provider, MD  atorvastatin (LIPITOR) 20 MG tablet Take 20 mg by mouth at bedtime.    Yes Historical Provider, MD  Cholecalciferol 50000 UNITS capsule Take 50,000 Units by mouth every 30 (thirty) days.   Yes Historical Provider, MD  Fluticasone-Salmeterol (ADVAIR) 100-50 MCG/DOSE AEPB Inhale 1 puff into the lungs every 12 (twelve) hours.   Yes Historical Provider, MD  folic acid (FOLVITE) 1 MG tablet Take 1 mg by mouth daily.   Yes Historical Provider, MD  hyaluronate sodium (RADIAPLEXRX) GEL Apply 1 application topically at bedtime.   Yes Historical Provider, MD  HYDROcodone-acetaminophen (NORCO/VICODIN) 5-325 MG per tablet Take one tablet by mouth every 4 hours as needed for pain or headache 06/04/13  Yes Tiffany L Reed, DO  ipratropium-albuterol (DUONEB) 0.5-2.5 (3) MG/3ML SOLN Take 3 mLs by nebulization every 6 (six) hours as needed (for shortness of breath).    Yes Historical Provider, MD  Lacosamide (VIMPAT) 100 MG TABS Take 1 tablet by mouth 2 (two) times daily.    Yes Historical Provider, MD  levETIRAcetam (KEPPRA) 1000 MG tablet Take 1,000 mg by mouth 2 (two) times daily.   Yes Historical Provider, MD  lidocaine-prilocaine (EMLA)  cream Apply topically as needed. Apply to port approx 1 hour before chemo appt, cover with tegaderm. 10/17/12  Yes Curt Bears, MD  loratadine (CLARITIN) 10 MG tablet Take 10 mg by mouth daily.   Yes Historical Provider, MD  mirtazapine (REMERON SOL-TAB) 15 MG disintegrating tablet Take 15 mg by mouth at bedtime.   Yes Historical Provider, MD  Multiple Vitamin (MULTIVITAMIN WITH MINERALS) TABS Take 1 tablet by mouth daily.   Yes Historical Provider, MD  Multiple Vitamins-Iron (MULTIVITAMINS WITH IRON) TABS tablet Take 1 tablet by mouth daily.   Yes Historical Provider, MD  phenytoin (DILANTIN) 100 MG ER capsule Take 200 mg by mouth 2 (two) times daily. Take 2 capsules bid   Yes Historical Provider, MD  pregabalin (LYRICA) 150 MG capsule Take one capsule by mouth every night at bedtime 08/10/13  Yes Tiffany L Reed, DO  prochlorperazine (COMPAZINE) 10 MG tablet Take 1 tablet (10 mg total) by mouth every 6 (six) hours as needed. 10/03/12 12/14/13 Yes Curt Bears, MD  sennosides-docusate sodium (SENOKOT-S) 8.6-50 MG tablet Take 1 tablet by mouth daily.    Yes Historical Provider, MD  nitroGLYCERIN (NITROSTAT) 0.4 MG SL tablet Place 0.4 mg under the tongue every 5 (five) minutes as needed.    Historical Provider, MD    No Known Allergies  Physical Exam  Vitals  Blood pressure 98/51, pulse 108, temperature 100.3 F (37.9 C), temperature source Rectal, resp. rate 19, height 5' 8.11" (1.73 m), weight 57.2 kg (126 lb 1.7 oz), SpO2 91.00%.   1. General elderly gentleman in no significant distress  2. psychiatrically I could not evaluate due to patient's condition  3. neurologically could not evaluate due to patient's condition  4. Ears and Eyes appear Normal, Conjunctivae clear, PERRLA. Moist Oral Mucosa.  5. Supple Neck, No JVD, No cervical lymphadenopathy appriciated, No Carotid Bruits.  6. Symmetrical Chest wall movement, decreased breath sounds on the right.  7. RRR, No Gallops, Rubs  or Murmurs, No Parasternal Heave.  8. Positive Bowel Sounds, Abdomen Soft, Non tender, No organomegaly appriciated,No rebound -guarding or rigidity.  9.  No Cyanosis, Normal Skin Turgor, No Skin Rash or Bruise.  10. Good muscle tone,  joints appear normal , no effusions, Normal ROM.  11. No Palpable Lymph Nodes in Neck  or Axillae    Data Review  CBC  Recent Labs Lab 10/09/13 1645  WBC 7.2  HGB 13.2  HCT 38.6*  PLT 126*  MCV 101.3*  MCH 34.6*  MCHC 34.2  RDW 12.5  LYMPHSABS 0.9  MONOABS 0.7  EOSABS 0.2  BASOSABS 0.0   ------------------------------------------------------------------------------------------------------------------  Chemistries   Recent Labs Lab 10/09/13 1645  NA 138  K 5.4*  CL 102  CO2 25  GLUCOSE 101*  BUN 9  CREATININE 0.72  CALCIUM 8.3*   ------------------------------------------------------------------------------------------------------------------ estimated creatinine clearance is 71.5 ml/min (by C-G formula based on Cr of 0.72). ------------------------------------------------------------------------------------------------------------------ No results found for this basename: TSH, T4TOTAL, FREET3, T3FREE, THYROIDAB,  in the last 72 hours     Urinalysis    Component Value Date/Time   COLORURINE YELLOW 10/09/2013 1710   APPEARANCEUR CLEAR 10/09/2013 1710   LABSPEC 1.020 10/09/2013 1710   PHURINE 5.5 10/09/2013 1710   GLUCOSEU NEGATIVE 10/09/2013 1710   HGBUR NEGATIVE 10/09/2013 1710   BILIRUBINUR NEGATIVE 10/09/2013 1710   KETONESUR 15* 10/09/2013 1710   PROTEINUR NEGATIVE 10/09/2013 1710   UROBILINOGEN 0.2 10/09/2013 1710   NITRITE NEGATIVE 10/09/2013 1710   LEUKOCYTESUR NEGATIVE 10/09/2013 1710    ----------------------------------------------------------------------------------------------------------------  A  Imaging results:   Dg Chest Portable 1 View  10/09/2013   CLINICAL DATA:  History pleural effusion  EXAM:  PORTABLE CHEST - 1 VIEW  COMPARISON:  CT dated 08/03/2013  FINDINGS: A right-sided Port-A-Cath is appreciated with tip project mid superior vena cava. Patient is status post median sternotomy coronary artery bypass grafting. There is diffuse consolidative density within the lung apex and base. Areas of sparing are identified centrally within the lower. The aerated portion of the lung demonstrates diffuse ground-glass appearance with prominence of interstitial markings. The left hemothorax is clear. The osseous structures demonstrate healed fracture involving the left humerus and degenerative changes within the left shoulder.  IMPRESSION: Findings consistent with the appearance described pleural effusion on chest CT. The orientation of the density likely reflects a loculated component. Atelectasis versus infiltrate versus neoplastic involvement of the aerated portion of the right hemithorax.   Electronically Signed   By: Margaree Mackintosh M.D.   On: 10/09/2013 17:27     Assessment & Plan  1. health community acquired pneumonia Versus worsening lung cancer ;    Start vancomycin and Maxipime    Check flu status    Check blood and sputum cultures    Place on oxygen    Neb treatments 2. Large right-sided pleural effusion with hypoxemia     Consult IR for thoracentesis 3. History of seizures    Continue with medications 4. History of lung cancer, metastatic 5. History of prostate cancer 6. History of carotid disease status post MI 7. History of hypertension   DVT Prophylaxis Heparin   AM Labs Ordered, also please review Full Orders  Code Status DO NOT RESUSCITATE  Disposition Plan: Back to the nursing home  Time spent in minutes : 33 minutes  Condition GUARDED

## 2013-10-09 NOTE — ED Notes (Signed)
Pt from [facility]. NP at facility states pt has pleural effusion and needs CT placement. Pt wears 2-3L [place] to prevent desatting. 105/72, HR 112

## 2013-10-09 NOTE — ED Notes (Signed)
Md did not want blood cultures prior to antibiotics

## 2013-10-09 NOTE — ED Provider Notes (Signed)
CSN: [id number]     Arrival date & time 10/09/13  1614 History   First MD Initiated Contact with Patient 10/09/13 1620     Chief Complaint  Patient presents with  . Pleural Effusion   (Consider location/radiation/quality/duration/timing/severity/associated sxs/prior Treatment) HPI  68 year old male transferred from nursing facility for evaluation of "Hypoxia. New loculated R pleural effusion." Little additional information provided aside from medication list and DO NOT RESUSCITATE paperwork. Most of history obtained from prior records.  Hx of metastatic non-small cell lung cancer, adenocarcinoma. Currently getting chemo. Apparently he had hypoxia earlier today recurrent supplemental oxygen. An x-ray was ordered which showed a large pleural effusion was referred to the emergency room. Patient is pleasant, but he has a very quiet voice and difficult to understand. He voices no complaints. He denies any pain anywhere. Denies any shortness of breath, although he was on 4 L of oxygen via nasal cannula when I entered room. This was turned off during my encounter with him. His oxygen saturations did drop down to the low 90s, but he still denied any shortness of breath prior to me leaving the exam room initially.  Past Medical History  Diagnosis Date  . Prostate cancer 04/22/09    Adenomacarcinoma,glleason=#=#=6,volume=32.9cc,PSA=5.91  . Gout     hx  . Myocardial infarction   . Anxiety   . Depression   . PVD (peripheral vascular disease)   . Stroke     hx syndrome  . Seizures   . Hypertension   . Hypercholesterolemia   . History of radiation therapy 08/25/2009-10/28/2009    prostate 78 Gy/40 fractions  . Lung cancer 08/29/12    bx= right  upper outer lobe lung=Adenocarcinoma  . Anemia   . CAD (coronary artery disease)   . Gangrene     hx  . Hemiparesis     left , and contracture lue  . Sinus tachycardia     hx  . Seizure 10/03/2012  . CAD (coronary artery disease) 10/03/2012  . HTN  (hypertension) 10/03/2012  . Stroke 10/03/2012  . Allergic rhinitis, cause unspecified 05/17/2013  . History of radiation therapy 10/25/12-12/14/12    lung 66Gy   Past Surgical History  Procedure Laterality Date  . 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 Lung biopsy  08/29/12    RUO lung=Adenocarcinoma  . Prostate biopsy  04/22/09    Adenocarcinoma   Family History  Problem Relation Age of Onset  . Hypertension Mother   . Stroke Father   . Cancer Neg Hx    History  Substance Use Topics  . Smoking status: Former Smoker    Quit date: 05/28/2012  . Smokeless tobacco: Not on file  . Alcohol Use: No    Review of Systems  All systems reviewed and negative, other than as noted in HPI.   Allergies  Review of patient's allergies indicates no known allergies.  Home Medications   Current Outpatient Rx  Name  Route  Sig  Dispense  Refill  . acetaminophen (TYLENOL) 325 MG tablet   Oral   Take 650 mg by mouth every 4 (four) hours as needed.         . Artificial Tear Ointment (AKWA TEARS) 0.5 % ophthalmic ointment   Right Eye   Place 1 drop into the right eye as needed (for dry eyes).          Marland Kitchen aspirin 81 MG tablet   Oral  Take 81 mg by mouth daily.         Marland Kitchen atorvastatin (LIPITOR) 20 MG tablet   Oral   Take 20 mg by mouth at bedtime.          . Cholecalciferol 50000 UNITS capsule   Oral   Take 50,000 Units by mouth every 30 (thirty) days.         . Fluticasone-Salmeterol (ADVAIR) 100-50 MCG/DOSE AEPB   Inhalation   Inhale 1 puff into the lungs every 12 (twelve) hours.         . folic acid (FOLVITE) 1 MG tablet   Oral   Take 1 mg by mouth daily.         . hyaluronate sodium (RADIAPLEXRX) GEL   Topical   Apply 1 application topically at bedtime.         Marland Kitchen HYDROcodone-acetaminophen (NORCO/VICODIN) 5-325 MG per tablet      Take one tablet by mouth every 4 hours as needed for pain or headache   180 tablet   0   .  ipratropium-albuterol (DUONEB) 0.5-2.5 (3) MG/3ML SOLN   Nebulization   Take 3 mLs by nebulization every 6 (six) hours as needed (for shortness of breath).          . Lacosamide (VIMPAT) 100 MG TABS      Take 1 tablet twice a day   60 tablet   5   . Lacosamide (VIMPAT) 100 MG TABS   Oral   Take 1 tablet by mouth.         . levETIRAcetam (KEPPRA) 1000 MG tablet   Oral   Take 1,000 mg by mouth 2 (two) times daily.         Marland Kitchen lidocaine-prilocaine (EMLA) cream   Topical   Apply topically as needed. Apply to port approx 1 hour before chemo appt, cover with tegaderm.   30 g   0   . loratadine (CLARITIN) 10 MG tablet   Oral   Take 10 mg by mouth daily.         . mirtazapine (REMERON SOL-TAB) 15 MG disintegrating tablet   Oral   Take 15 mg by mouth at bedtime.         . Multiple Vitamin (MULTIVITAMIN WITH MINERALS) TABS   Oral   Take 1 tablet by mouth daily.         . Multiple Vitamins-Iron (MULTIVITAMINS WITH IRON) TABS tablet   Oral   Take 1 tablet by mouth daily.         . phenytoin (DILANTIN) 100 MG ER capsule   Oral   Take 200 mg by mouth 2 (two) times daily. Take 2 capsules bid         . pregabalin (LYRICA) 150 MG capsule      Take one capsule by mouth every night at bedtime   30 capsule   5   . prochlorperazine (COMPAZINE) 10 MG tablet   Oral   Take 1 tablet (10 mg total) by mouth every 6 (six) hours as needed.   60 tablet   0   . sennosides-docusate sodium (SENOKOT-S) 8.6-50 MG tablet   Oral   Take 1 tablet by mouth daily.          . nitroGLYCERIN (NITROSTAT) 0.4 MG SL tablet   Sublingual   Place 0.4 mg under the tongue every 5 (five) minutes as needed.          BP 107/65  Pulse 114  Temp(Src)  100.1 F (37.8 C) (Oral)  Resp 19  SpO2 96% Physical Exam  Nursing note and vitals reviewed. Constitutional: He appears well-developed and well-nourished. No distress.  HENT:  Head: Normocephalic and atraumatic.  Eyes: Conjunctivae  are normal. Right eye exhibits no discharge. Left eye exhibits no discharge.  Neck: Neck supple.  Cardiovascular: Regular rhythm and normal heart sounds.  Exam reveals no gallop and no friction rub.   No murmur heard. Mild tachycardia with regular rhythm  Pulmonary/Chest: Effort normal.  Decreased breath sounds right side. Left lung clear  Abdominal: Soft. He exhibits no distension. There is no tenderness.  Musculoskeletal: He exhibits no edema and no tenderness.  L AKA  Neurological: He is alert.  Skin: Skin is warm and dry.  Psychiatric: He has a normal mood and affect. His behavior is normal. Thought content normal.    ED Course  Procedures (including critical care time) Labs Review Labs Reviewed - No data to display Imaging Review No results found.  EKG Interpretation   None       MDM   1. HCAP (healthcare-associated pneumonia)   2. Metastatic lung carcinoma     68 year old male with hypoxemia prior to arrival and a new loculated right pleural effusion. Could be malignant with history. Consider infectious. Temperature 100.3. Technically not a fever, but given the new x-ray findings and reported hypoxemia, will cover for possible HCAP because of residing in NH and frequent visits to medical setting for chemo/evaluation.      Virgel Manifold, MD 10/12/13 1420

## 2013-10-09 NOTE — ED Notes (Signed)
Attempted to give report 

## 2013-10-10 ENCOUNTER — Inpatient Hospital Stay (HOSPITAL_COMMUNITY): Payer: PRIVATE HEALTH INSURANCE

## 2013-10-10 ENCOUNTER — Other Ambulatory Visit: Payer: Self-pay | Admitting: Radiology

## 2013-10-10 DIAGNOSIS — R569 Unspecified convulsions: Secondary | ICD-10-CM

## 2013-10-10 LAB — BODY FLUID CELL COUNT WITH DIFFERENTIAL
EOS FL: 1 %
LYMPHS FL: 8 %
Monocyte-Macrophage-Serous Fluid: 8 % — ABNORMAL LOW (ref 50–90)
Neutrophil Count, Fluid: 83 % — ABNORMAL HIGH (ref 0–25)
Total Nucleated Cell Count, Fluid: 1803 cu mm — ABNORMAL HIGH (ref 0–1000)

## 2013-10-10 LAB — CBC
HEMATOCRIT: 34 % — AB (ref 39.0–52.0)
Hemoglobin: 13 g/dL (ref 13.0–17.0)
MCH: 31.6 pg (ref 26.0–34.0)
MCHC: 32.1 g/dL (ref 30.0–36.0)
MCV: 98.6 fL (ref 78.0–100.0)
Platelets: 126 10*3/uL — ABNORMAL LOW (ref 150–400)
RBC: 3.45 MIL/uL — ABNORMAL LOW (ref 4.22–5.81)
RDW: 12.5 % (ref 11.5–15.5)
WBC: 7.7 10*3/uL (ref 4.0–10.5)

## 2013-10-10 LAB — LACTATE DEHYDROGENASE, PLEURAL OR PERITONEAL FLUID: LD FL: 307 U/L — AB (ref 3–23)

## 2013-10-10 LAB — PROTEIN, BODY FLUID: Total protein, fluid: 4.6 g/dL

## 2013-10-10 LAB — GLUCOSE, PERITONEAL FLUID: Glucose, Peritoneal Fluid: 96 mg/dL

## 2013-10-10 LAB — BASIC METABOLIC PANEL
BUN: 8 mg/dL (ref 6–23)
CO2: 21 mEq/L (ref 19–32)
Calcium: 8.6 mg/dL (ref 8.4–10.5)
Chloride: 101 mEq/L (ref 96–112)
Creatinine, Ser: 0.71 mg/dL (ref 0.50–1.35)
GFR calc Af Amer: >90 mL/min (ref >90)
GFR calc non Af Amer: >90 mL/min (ref >90)
Glucose, Bld: 107 mg/dL — ABNORMAL HIGH (ref 70–99)
Potassium: 3.7 mEq/L (ref 3.7–5.3)
Sodium: 137 mEq/L (ref 137–147)

## 2013-10-10 LAB — MRSA PCR SCREENING: MRSA by PCR: NEGATIVE

## 2013-10-10 NOTE — Progress Notes (Signed)
10/10/2012 patient is alert to self unable to complete admission. [facility] RN.

## 2013-10-10 NOTE — Clinical Social Work Psychosocial (Signed)
Clinical Social Work Department BRIEF PSYCHOSOCIAL ASSESSMENT 10/10/2013  Patient:  [name], [name]     Account Number:  [id number]     Admit date:  10/09/2013  Clinical Social Worker:  Frederico Hamman  Date/Time:  10/10/2013 04:20 AM  Referred by:  Physician  Date Referred:  10/10/2013 Referred for  SNF Placement   Other Referral:   Interview type:  Family Other interview type:   CSW talked with patient's sister and POA [name] [phone]).    PSYCHOSOCIAL DATA Living Status:  FACILITY Admitted from facility:  [facility] Level of care:  [place] Primary support name:  [name] Primary support relationship to patient:  SIBLING Degree of support available:   [name] assists with making decisions for patient.    CURRENT CONCERNS Current Concerns  Post-Acute Placement   Other Concerns:    SOCIAL WORK ASSESSMENT / PLAN CSW unable to talk with patient as he is not totally oriented per RN. CSW contacted [name] and talked with her to confirm patient's return to [facility] skilled nursing facility. Per sister, patient will return there.   Assessment/plan status:  Psychosocial Support/Ongoing Assessment of Needs Other assessment/ plan:   Information/referral to community resources:   None requested or needed at this time.    PATIENT'S/FAMILY'S RESPONSE TO PLAN OF CARE: [name] voice was flat and answeres to questions very bried, however she did indicated that patient will return to [facility].

## 2013-10-10 NOTE — Progress Notes (Signed)
[facility] PROGRESS NOTE  [name] TKP:[id number] DOB: August 06, 1946 DOA: 10/09/2013 PCP: No primary provider on file.  Assessment/Plan: health community acquired pneumonia Versus worsening lung cancer: Start vancomycin and Maxipime  Flu negative Check blood and sputum cultures  Place on oxygen - wean as tolerated Neb treatments   Large right-sided pleural effusion with hypoxemia  Consult IR for thoracentesis  -will order studies  History of seizures  Continue with medications   History of lung cancer, metastatic  History of prostate cancer   History of carotid disease status post MI   History of hypertension   Code Status: DNR Family Communication: patient Disposition Plan: back to SNF when work up done   Consultants:  IR  Procedures:  none  Antibiotics:  vanc/maxipime  HPI/Subjective: Feeling better  Objective: Filed Vitals:   10/09/13 2208  BP: 93/62  Pulse: 116  Temp: 99.4 F (37.4 C)  Resp: 28    Intake/Output Summary (Last 24 hours) at 10/10/13 0917 Last data filed at 10/09/13 2149  Gross per 24 hour  Intake    200 ml  Output      0 ml  Net    200 ml   Filed Weights   10/09/13 1718 10/09/13 2207  Weight: 57.2 kg (126 lb 1.7 oz) 59.6 kg (131 lb 6.3 oz)    Exam:   General:  NAD- no increased work of breathing  Cardiovascular: rrr  Respiratory: decreased on right  Abdomen: +BS, soft  Musculoskeletal: s/p  AKA  Data Reviewed: Basic Metabolic Panel:  Recent Labs Lab 10/09/13 1645 10/10/13 0353  NA 138 137  K 5.4* 3.7  CL 102 101  CO2 25 21  GLUCOSE 101* 107*  BUN 9 8  CREATININE 0.72 0.71  CALCIUM 8.3* 8.6   Liver Function Tests: No results found for this basename: AST, ALT, ALKPHOS, BILITOT, PROT, ALBUMIN,  in the last 168 hours No results found for this basename: LIPASE, AMYLASE,  in the last 168 hours No results found for this basename: AMMONIA,  in the last 168 hours CBC:  Recent Labs Lab  10/09/13 1645 10/10/13 0353  WBC 7.2 7.7  NEUTROABS 5.5  --   HGB 13.2 13.0  HCT 38.6* 34.0*  MCV 101.3* 98.6  PLT 126* 126*   Cardiac Enzymes: No results found for this basename: CKTOTAL, CKMB, CKMBINDEX, TROPONINI,  in the last 168 hours BNP (last 3 results)  Recent Labs  10/09/13 1730  PROBNP 234.6*   CBG: No results found for this basename: GLUCAP,  in the last 168 hours  Recent Results (from the past 240 hour(s))  MRSA PCR SCREENING     Status: None   Collection Time    10/09/13 10:32 PM      Result Value Range Status   MRSA by PCR NEGATIVE  NEGATIVE Final   Comment:            The GeneXpert MRSA Assay (FDA     approved for NASAL specimens     only), is one component of a     comprehensive MRSA colonization     surveillance program. It is not     intended to diagnose MRSA     infection nor to guide or     monitor treatment for     MRSA infections.     Studies: Dg Chest Portable 1 View  10/09/2013   CLINICAL DATA:  History pleural effusion  EXAM: PORTABLE CHEST - 1 VIEW  COMPARISON:  CT dated  08/03/2013  FINDINGS: A right-sided Port-A-Cath is appreciated with tip project mid superior vena cava. Patient is status post median sternotomy coronary artery bypass grafting. There is diffuse consolidative density within the lung apex and base. Areas of sparing are identified centrally within the lower. The aerated portion of the lung demonstrates diffuse ground-glass appearance with prominence of interstitial markings. The left hemothorax is clear. The osseous structures demonstrate healed fracture involving the left humerus and degenerative changes within the left shoulder.  IMPRESSION: Findings consistent with the appearance described pleural effusion on chest CT. The orientation of the density likely reflects a loculated component. Atelectasis versus infiltrate versus neoplastic involvement of the aerated portion of the right hemithorax.   Electronically Signed   By: Margaree Mackintosh M.D.   On: 10/09/2013 17:27    Scheduled Meds: . albuterol  2.5 mg Nebulization Q6H  . atorvastatin  20 mg Oral QHS  . ceFEPime (MAXIPIME) IV  1 g Intravenous Z3P  . folic acid  1 mg Oral Daily  . heparin  5,000 Units Subcutaneous Q8H  . ipratropium  0.5 mg Nebulization Q6H  . lacosamide  100 mg Oral BID  . levETIRAcetam  1,000 mg Oral BID  . loratadine  10 mg Oral Daily  . mirtazapine  15 mg Oral QHS  . mometasone-formoterol  2 puff Inhalation BID  . phenytoin  200 mg Oral BID  . pregabalin  150 mg Oral Daily  . senna-docusate  1 tablet Oral Daily  . vancomycin  750 mg Intravenous Q12H  . [START ON 11/09/2013] Vitamin D (Ergocalciferol)  50,000 Units Oral Q30 days   Continuous Infusions: . sodium chloride 50 mL/hr at 10/09/13 2245    Principal Problem:   Healthcare-associated pneumonia Active Problems:   Pleural effusion   HCAP (healthcare-associated pneumonia)    Time spent: 57 min    [name]  [facility] Pager [phone]. If 7PM-7AM, please contact night-coverage at [email or web address], password [facility] 10/10/2013, 9:17 AM  LOS: 1 day

## 2013-10-10 NOTE — Procedures (Signed)
Successful US guided right thoracentesis. Yielded 1.4L of amber colored pleural  fluid. Pt tolerated procedure well. No immediate complications.  Specimen was sent for labs. CXR ordered.  Ascencion Dike PA-C 10/10/2013 2:04 PM

## 2013-10-11 MED ORDER — ALBUMIN HUMAN 25 % IV SOLN
12.5000 g | Freq: Once | INTRAVENOUS | Status: AC
  Administered 2013-10-11: 12.5 g via INTRAVENOUS
  Filled 2013-10-11: qty 50

## 2013-10-11 MED ORDER — IPRATROPIUM-ALBUTEROL 0.5-2.5 (3) MG/3ML IN SOLN
3.0000 mL | Freq: Three times a day (TID) | RESPIRATORY_TRACT | Status: DC
  Administered 2013-10-11 – 2013-10-17 (×19): 3 mL via RESPIRATORY_TRACT
  Filled 2013-10-11 (×18): qty 3

## 2013-10-11 NOTE — Progress Notes (Addendum)
[facility] PROGRESS NOTE  [name] EPP:[id number] DOB: 1946/01/09 DOA: 10/09/2013 PCP: No primary provider on file.  Assessment/Plan:  health community acquired pneumonia Versus worsening lung cancer: Start vancomycin and Maxipime  Flu negative Blood culture still pending Place on oxygen - wean as tolerated Neb treatments   Large right-sided pleural effusion with hypoxemia  Consult IR for thoracentesis  -Appreciate interventional radiology input-thoracocentesis performed 1/14 -The effusion is probably exudative given history, LDH of the fluid is 307 -Await cultures to guide therapy -Slightly hypotensive which could be secondary to volume loss, will give albumin x1 now  History of seizures  Continue with medications   History of lung cancer, metastatic -History of prostate cancer  Will discuss with family the course of care and possible hospice  History of carotid disease status post MI   History of hypertension   Code Status: DNR Family Communication: Locklear,Margaret Sister [phone]-Spoke to her about case and [place].  She confirms DNR. She doesn't know if he would want hospice however-June 2014 was when he stated he didn't want aggressive measures Will review again in am and see if any better and determine form there options Disposition Plan: back to SNF when work up done   Consultants:  IR  Procedures:  none  Antibiotics:  vanc/maxipime  HPI/Subjective:  completely disoriented Sleeping at bedside Number which hospital he then Nursing reports has been fair No nausea no vomiting Heart rate noted at bedside to be 120 range    Objective: Filed Vitals:   10/11/13 0900  BP: 103/62  Pulse: 121  Temp: 99.8 F (37.7 C)  Resp: 16    Intake/Output Summary (Last 24 hours) at 10/11/13 1403 Last data filed at 10/11/13 0600  Gross per 24 hour  Intake 1812.5 ml  Output      0 ml  Net 1812.5 ml   Filed Weights   10/09/13 1718 10/09/13  2207 10/10/13 2105  Weight: 57.2 kg (126 lb 1.7 oz) 59.6 kg (131 lb 6.3 oz) 59.6 kg (131 lb 6.3 oz)    Exam:   General:  NAD- no increased work of breathing  Cardiovascular: rrr  Respiratory: decreased on right  Abdomen: +BS, soft  Musculoskeletal: s/p  AKA  Data Reviewed: Basic Metabolic Panel:  Recent Labs Lab 10/09/13 1645 10/10/13 0353  NA 138 137  K 5.4* 3.7  CL 102 101  CO2 25 21  GLUCOSE 101* 107*  BUN 9 8  CREATININE 0.72 0.71  CALCIUM 8.3* 8.6   Liver Function Tests: No results found for this basename: AST, ALT, ALKPHOS, BILITOT, PROT, ALBUMIN,  in the last 168 hours No results found for this basename: LIPASE, AMYLASE,  in the last 168 hours No results found for this basename: AMMONIA,  in the last 168 hours CBC:  Recent Labs Lab 10/09/13 1645 10/10/13 0353  WBC 7.2 7.7  NEUTROABS 5.5  --   HGB 13.2 13.0  HCT 38.6* 34.0*  MCV 101.3* 98.6  PLT 126* 126*   Cardiac Enzymes: No results found for this basename: CKTOTAL, CKMB, CKMBINDEX, TROPONINI,  in the last 168 hours BNP (last 3 results)  Recent Labs  10/09/13 1730  PROBNP 234.6*   CBG: No results found for this basename: GLUCAP,  in the last 168 hours  Recent Results (from the past 240 hour(s))  MRSA PCR SCREENING     Status: None   Collection Time    10/09/13 10:32 PM      Result Value Range Status   MRSA  by PCR NEGATIVE  NEGATIVE Final   Comment:            The GeneXpert MRSA Assay (FDA     approved for NASAL specimens     only), is one component of a     comprehensive MRSA colonization     surveillance program. It is not     intended to diagnose MRSA     infection nor to guide or     monitor treatment for     MRSA infections.  CULTURE, BLOOD (ROUTINE X 2)     Status: None   Collection Time    10/10/13 12:30 AM      Result Value Range Status   Specimen Description BLOOD RIGHT HAND   Final   Special Requests BOTTLES DRAWN AEROBIC ONLY 3CC   Final   Culture  Setup Time      Final   Value: 10/10/2013 12:14     Performed at [facility]   Culture     Final   Value:        BLOOD CULTURE RECEIVED NO GROWTH TO DATE CULTURE WILL BE HELD FOR 5 DAYS BEFORE ISSUING A FINAL NEGATIVE REPORT     Performed at [facility]   Report Status PENDING   Incomplete  CULTURE, BLOOD (ROUTINE X 2)     Status: None   Collection Time    10/10/13 12:35 AM      Result Value Range Status   Specimen Description BLOOD RIGHT WRIST   Final   Special Requests BOTTLES DRAWN AEROBIC ONLY 1CC   Final   Culture  Setup Time     Final   Value: 10/10/2013 12:14     Performed at [facility]   Culture     Final   Value:        BLOOD CULTURE RECEIVED NO GROWTH TO DATE CULTURE WILL BE HELD FOR 5 DAYS BEFORE ISSUING A FINAL NEGATIVE REPORT     Performed at [facility]   Report Status PENDING   Incomplete  BODY FLUID CULTURE     Status: None   Collection Time    10/10/13  1:53 PM      Result Value Range Status   Specimen Description FLUID RIGHT PLEURAL   Final   Special Requests NONE   Final   Gram Stain     Final   Value: RARE WBC PRESENT,BOTH PMN AND MONONUCLEAR     NO ORGANISMS SEEN     Performed at [facility]   Culture     Final   Value: NO GROWTH 1 DAY     Performed at [facility]   Report Status PENDING   Incomplete     Studies: Dg Chest 1 View  10/10/2013   CLINICAL DATA:  Post thoracentesis  EXAM: CHEST - 1 VIEW  COMPARISON:  10/09/2013  FINDINGS: Right effusion improved. No pneumothorax. Stable Port-A-Cath. Stable opacities within the right lung.  IMPRESSION: No pneumothorax post thoracentesis.   Electronically Signed   By: Maryclare Bean M.D.   On: 10/10/2013 14:35   Dg Chest Portable 1 View  10/09/2013   CLINICAL DATA:  History pleural effusion  EXAM: PORTABLE CHEST - 1 VIEW  COMPARISON:  CT dated 08/03/2013  FINDINGS: A right-sided Port-A-Cath is appreciated with tip project mid superior vena cava. Patient is status post median  sternotomy coronary artery bypass grafting. There is diffuse consolidative density within the lung apex and  base. Areas of sparing are identified centrally within the lower. The aerated portion of the lung demonstrates diffuse ground-glass appearance with prominence of interstitial markings. The left hemothorax is clear. The osseous structures demonstrate healed fracture involving the left humerus and degenerative changes within the left shoulder.  IMPRESSION: Findings consistent with the appearance described pleural effusion on chest CT. The orientation of the density likely reflects a loculated component. Atelectasis versus infiltrate versus neoplastic involvement of the aerated portion of the right hemithorax.   Electronically Signed   By: Margaree Mackintosh M.D.   On: 10/09/2013 17:27   US Thoracentesis Asp Pleural Space W/img Guide  10/10/2013   CLINICAL DATA:  Right-sided pleural effusion. Request diagnostic and therapeutic thoracentesis.  EXAM: ULTRASOUND GUIDED right THORACENTESIS  COMPARISON:  None.  FINDINGS: A total of approximately 1.4 L of amber colored fluid was removed. A fluid sample was sent for laboratory analysis.  IMPRESSION: Successful ultrasound guided right thoracentesis yielding 1.4 L of pleural fluid.  Read by: Ascencion Dike PA-C  PROCEDURE: An ultrasound guided thoracentesis was thoroughly discussed with the patient and questions answered. The benefits, risks, alternatives and complications were also discussed. The patient understands and wishes to proceed with the procedure. Written consent was obtained.  Ultrasound was performed to localize and mark an adequate pocket of fluid in the right chest. The area was then prepped and draped in the normal sterile fashion. 1% Lidocaine was used for local anesthesia. Under ultrasound guidance a 19 gauge Yueh catheter was introduced. Thoracentesis was performed. The catheter was removed and a dressing applied.  Complications:  None immediate    Electronically Signed   By: Sandi Mariscal M.D.   On: 10/10/2013 14:32    Scheduled Meds: . atorvastatin  20 mg Oral QHS  . ceFEPime (MAXIPIME) IV  1 g Intravenous E7N  . folic acid  1 mg Oral Daily  . heparin  5,000 Units Subcutaneous Q8H  . ipratropium-albuterol  3 mL Nebulization TID  . lacosamide  100 mg Oral BID  . levETIRAcetam  1,000 mg Oral BID  . loratadine  10 mg Oral Daily  . mirtazapine  15 mg Oral QHS  . mometasone-formoterol  2 puff Inhalation BID  . phenytoin  200 mg Oral BID  . pregabalin  150 mg Oral Daily  . senna-docusate  1 tablet Oral Daily  . vancomycin  750 mg Intravenous Q12H  . [START ON 11/09/2013] Vitamin D (Ergocalciferol)  50,000 Units Oral Q30 days   Continuous Infusions: . sodium chloride 50 mL/hr at 10/10/13 2015    Principal Problem:   Healthcare-associated pneumonia Active Problems:   Pleural effusion   HCAP (healthcare-associated pneumonia)    Time spent: 66 min    Verlon Au [place] [facility] Pager [phone]. If 7PM-7AM, please contact night-coverage at [email or web address], password [facility] 10/11/2013, 2:03 PM  LOS: 2 days

## 2013-10-11 NOTE — Progress Notes (Signed)
Utilization review completed. Jacorion Klem, RN, BSN. 

## 2013-10-12 ENCOUNTER — Inpatient Hospital Stay (HOSPITAL_COMMUNITY): Payer: PRIVATE HEALTH INSURANCE

## 2013-10-12 ENCOUNTER — Other Ambulatory Visit: Payer: Self-pay

## 2013-10-12 LAB — VANCOMYCIN, TROUGH: Vancomycin Tr: 17.7 ug/mL (ref 10.0–20.0)

## 2013-10-12 NOTE — Progress Notes (Signed)
ANTIBIOTIC CONSULT NOTE - FOLLOW UP  Pharmacy Consult for Vanco Indication: PNA  No Known Allergies  Patient Measurements: Height: 5\' 8"  (172.7 cm) Weight: 131 lb 6.3 oz (59.6 kg) IBW/kg (Calculated) : 68.4 Adjusted Body Weight:   Vital Signs: Temp: 98.4 F (36.9 C) (01/16 1907) Temp src: Oral (01/16 1907) BP: 134/67 mmHg (01/16 1907) Pulse Rate: 100 (01/16 1907) Intake/Output from previous day:   Intake/Output from this shift: Total I/O In: 360 [P.O.:360] Out: 400 [Urine:400]  Labs:  Recent Labs  10/10/13 0353  WBC 7.7  HGB 13.0  PLT 126*  CREATININE 0.71   Estimated Creatinine Clearance: 74.5 ml/min (by C-G formula based on Cr of 0.71).  Recent Labs  10/12/13 1930  Funk 17.7     Microbiology: Recent Results (from the past 720 hour(s))  MRSA PCR SCREENING     Status: None   Collection Time    10/09/13 10:32 PM      Result Value Range Status   MRSA by PCR NEGATIVE  NEGATIVE Final   Comment:            The GeneXpert MRSA Assay (FDA     approved for NASAL specimens     only), is one component of a     comprehensive MRSA colonization     surveillance program. It is not     intended to diagnose MRSA     infection nor to guide or     monitor treatment for     MRSA infections.  CULTURE, BLOOD (ROUTINE X 2)     Status: None   Collection Time    10/10/13 12:30 AM      Result Value Range Status   Specimen Description BLOOD RIGHT HAND   Final   Special Requests BOTTLES DRAWN AEROBIC ONLY 3CC   Final   Culture  Setup Time     Final   Value: 10/10/2013 12:14     Performed at [facility]   Culture     Final   Value:        BLOOD CULTURE RECEIVED NO GROWTH TO DATE CULTURE WILL BE HELD FOR 5 DAYS BEFORE ISSUING A FINAL NEGATIVE REPORT     Performed at [facility]   Report Status PENDING   Incomplete  CULTURE, BLOOD (ROUTINE X 2)     Status: None   Collection Time    10/10/13 12:35 AM      Result Value Range Status   Specimen  Description BLOOD RIGHT WRIST   Final   Special Requests BOTTLES DRAWN AEROBIC ONLY 1CC   Final   Culture  Setup Time     Final   Value: 10/10/2013 12:14     Performed at [facility]   Culture     Final   Value:        BLOOD CULTURE RECEIVED NO GROWTH TO DATE CULTURE WILL BE HELD FOR 5 DAYS BEFORE ISSUING A FINAL NEGATIVE REPORT     Performed at [facility]   Report Status PENDING   Incomplete  BODY FLUID CULTURE     Status: None   Collection Time    10/10/13  1:53 PM      Result Value Range Status   Specimen Description FLUID RIGHT PLEURAL   Final   Special Requests NONE   Final   Gram Stain     Final   Value: RARE WBC PRESENT,BOTH PMN AND MONONUCLEAR     NO ORGANISMS SEEN  Performed at [facility]     Final   Value: NO GROWTH 2 DAYS     Performed at [facility]   Report Status PENDING   Incomplete    Anti-infectives   Start     Dose/Rate Route Frequency Ordered Stop   10/10/13 0800  vancomycin (VANCOCIN) IVPB 750 mg/150 ml premix     750 mg 150 mL/hr over 60 Minutes Intravenous Every 12 hours 10/09/13 2024 10/16/13 1959   10/09/13 2200  ceFEPIme (MAXIPIME) 1 g in dextrose 5 % 50 mL IVPB  Status:  Discontinued     1 g 100 mL/hr over 30 Minutes Intravenous Every 12 hours 10/09/13 1753 10/09/13 2016   10/09/13 2200  ceFEPIme (MAXIPIME) 1 g in dextrose 5 % 50 mL IVPB     1 g 100 mL/hr over 30 Minutes Intravenous 3 times per day 10/09/13 2016 10/17/13 1959   10/09/13 2030  vancomycin (VANCOCIN) IVPB 1000 mg/200 mL premix     1,000 mg 200 mL/hr over 60 Minutes Intravenous  Once 10/09/13 2023 10/09/13 2148   10/09/13 1800  vancomycin (VANCOCIN) IVPB 1000 mg/200 mL premix     1,000 mg 200 mL/hr over 60 Minutes Intravenous  Once 10/09/13 1753 10/09/13 1922      Assessment:  Vancomycin trough is therapeutic at 17.7.  Plan:   Continue Vancomycin 750mg  IV q12  Candie Mile 10/12/2013,8:18 PM

## 2013-10-12 NOTE — Progress Notes (Signed)
[facility] PROGRESS NOTE  [name] BHA:[id number] DOB: 02/28/1946 DOA: 10/09/2013 PCP: No primary provider on file.  Assessment/Plan:  Post-obstructive pneumonia Versus worsening lung cancer: Start vancomycin and Maxipime-Will narrow to Levaquin 1/16 Flu negative Blood culture neg since 1/14, Pleural cult neg since 1/14 Place on oxygen - desat to 84% at rest in bed when taken off O2 Neb treatments   Large right-sided pleural effusion with hypoxemia  -Appreciate interventional radiology input-thoracocentesis performed 1/14 -The effusion is probably exudative given history, LDH of the fluid is 307 -Slightly hypotensive which could be secondary to volume loss, will give albumin x1 now -  History of seizures  Continue with medications   History of lung cancer, metastatic -History of prostate cancer  Will discuss with family the course of care and possible hospice  History of carotid disease status post MI   History of hypertension   Code Status: DNR Family Communication: Locklear,Margaret Sister [phone]-Spoke to her about case and [place].  She confirms DNR. She doesn't know if he would want hospice however-June 2014 was when he stated he didn't want aggressive measures Will review again in am and see if any better and determine form there options Disposition Plan: back to SNF when work up done   Consultants:  IR  Procedures:  none  Antibiotics:  vanc/maxipime  HPI/Subjective:  completely disoriented Sleeping  And tryig to get OOB Tol diet poorly No recent stool per RN. Looks a little better than yeaterday    Objective: Filed Vitals:   10/12/13 0523  BP: 98/61  Pulse: 95  Temp: 98.9 F (37.2 C)  Resp: 18    Intake/Output Summary (Last 24 hours) at 10/12/13 1226 Last data filed at 10/12/13 1133  Gross per 24 hour  Intake    200 ml  Output      0 ml  Net    200 ml   Filed Weights   10/09/13 2207 10/10/13 2105 10/11/13 2025   Weight: 59.6 kg (131 lb 6.3 oz) 59.6 kg (131 lb 6.3 oz) 59.6 kg (131 lb 6.3 oz)    Exam:   General:  NAD- no increased work of breathing  Cardiovascular: rrr  Respiratory: decreased ++ on right, Inc TVF/TVR  Abdomen: +BS, soft  Musculoskeletal: s/p  AKA  Data Reviewed: Basic Metabolic Panel:  Recent Labs Lab 10/09/13 1645 10/10/13 0353  NA 138 137  K 5.4* 3.7  CL 102 101  CO2 25 21  GLUCOSE 101* 107*  BUN 9 8  CREATININE 0.72 0.71  CALCIUM 8.3* 8.6   Liver Function Tests: No results found for this basename: AST, ALT, ALKPHOS, BILITOT, PROT, ALBUMIN,  in the last 168 hours No results found for this basename: LIPASE, AMYLASE,  in the last 168 hours No results found for this basename: AMMONIA,  in the last 168 hours CBC:  Recent Labs Lab 10/09/13 1645 10/10/13 0353  WBC 7.2 7.7  NEUTROABS 5.5  --   HGB 13.2 13.0  HCT 38.6* 34.0*  MCV 101.3* 98.6  PLT 126* 126*   Cardiac Enzymes: No results found for this basename: CKTOTAL, CKMB, CKMBINDEX, TROPONINI,  in the last 168 hours BNP (last 3 results)  Recent Labs  10/09/13 1730  PROBNP 234.6*   CBG: No results found for this basename: GLUCAP,  in the last 168 hours  Recent Results (from the past 240 hour(s))  MRSA PCR SCREENING     Status: None   Collection Time    10/09/13 10:32 PM  Result Value Range Status   MRSA by PCR NEGATIVE  NEGATIVE Final   Comment:            The GeneXpert MRSA Assay (FDA     approved for NASAL specimens     only), is one component of a     comprehensive MRSA colonization     surveillance program. It is not     intended to diagnose MRSA     infection nor to guide or     monitor treatment for     MRSA infections.  CULTURE, BLOOD (ROUTINE X 2)     Status: None   Collection Time    10/10/13 12:30 AM      Result Value Range Status   Specimen Description BLOOD RIGHT HAND   Final   Special Requests BOTTLES DRAWN AEROBIC ONLY 3CC   Final   Culture  Setup Time     Final    Value: 10/10/2013 12:14     Performed at [facility]   Culture     Final   Value:        BLOOD CULTURE RECEIVED NO GROWTH TO DATE CULTURE WILL BE HELD FOR 5 DAYS BEFORE ISSUING A FINAL NEGATIVE REPORT     Performed at [facility]   Report Status PENDING   Incomplete  CULTURE, BLOOD (ROUTINE X 2)     Status: None   Collection Time    10/10/13 12:35 AM      Result Value Range Status   Specimen Description BLOOD RIGHT WRIST   Final   Special Requests BOTTLES DRAWN AEROBIC ONLY 1CC   Final   Culture  Setup Time     Final   Value: 10/10/2013 12:14     Performed at [facility]   Culture     Final   Value:        BLOOD CULTURE RECEIVED NO GROWTH TO DATE CULTURE WILL BE HELD FOR 5 DAYS BEFORE ISSUING A FINAL NEGATIVE REPORT     Performed at [facility]   Report Status PENDING   Incomplete  BODY FLUID CULTURE     Status: None   Collection Time    10/10/13  1:53 PM      Result Value Range Status   Specimen Description FLUID RIGHT PLEURAL   Final   Special Requests NONE   Final   Gram Stain     Final   Value: RARE WBC PRESENT,BOTH PMN AND MONONUCLEAR     NO ORGANISMS SEEN     Performed at [facility]   Culture     Final   Value: NO GROWTH 2 DAYS     Performed at [facility]   Report Status PENDING   Incomplete     Studies: Dg Chest 1 View  10/10/2013   CLINICAL DATA:  Post thoracentesis  EXAM: CHEST - 1 VIEW  COMPARISON:  10/09/2013  FINDINGS: Right effusion improved. No pneumothorax. Stable Port-A-Cath. Stable opacities within the right lung.  IMPRESSION: No pneumothorax post thoracentesis.   Electronically Signed   By: Maryclare Bean M.D.   On: 10/10/2013 14:35   Dg Chest Port 1 View  10/12/2013   CLINICAL DATA:  Shortness of breath.  Effusion.  EXAM: PORTABLE CHEST - 1 VIEW  COMPARISON:  10/10/2013.  FINDINGS: Patient is rotated to the right. Port-A-Cath in stable position. Progressive right pleural effusion noted. Underlying  atelectasis and/or infiltrate cannot be excluded. There is near  opacification of the right hemithorax. Prior CABG. Heart size cannot be evaluated due to opacification of right in the thorax. There is mild pulmonary venous congestion and pulmonary interstitial prominence on the left. A component of congestive heart failure may be present. No pneumothorax. No acute osseous abnormality.  IMPRESSION: 1. Near complete opacification right hemithorax consistent with progressive right pleural effusion. Underlying infiltrate and atelectasis may be present. 2. Pulmonary venous congestion with interstitial prominence on the left noted. A component of congestive heart failure may be present.   Electronically Signed   By: Marcello Moores  Register   On: 10/12/2013 07:36   US Thoracentesis Asp Pleural Space W/img Guide  10/10/2013   CLINICAL DATA:  Right-sided pleural effusion. Request diagnostic and therapeutic thoracentesis.  EXAM: ULTRASOUND GUIDED right THORACENTESIS  COMPARISON:  None.  FINDINGS: A total of approximately 1.4 L of amber colored fluid was removed. A fluid sample was sent for laboratory analysis.  IMPRESSION: Successful ultrasound guided right thoracentesis yielding 1.4 L of pleural fluid.  Read by: Ascencion Dike PA-C  PROCEDURE: An ultrasound guided thoracentesis was thoroughly discussed with the patient and questions answered. The benefits, risks, alternatives and complications were also discussed. The patient understands and wishes to proceed with the procedure. Written consent was obtained.  Ultrasound was performed to localize and mark an adequate pocket of fluid in the right chest. The area was then prepped and draped in the normal sterile fashion. 1% Lidocaine was used for local anesthesia. Under ultrasound guidance a 19 gauge Yueh catheter was introduced. Thoracentesis was performed. The catheter was removed and a dressing applied.  Complications:  None immediate   Electronically Signed   By: Sandi Mariscal M.D.    On: 10/10/2013 14:32    Scheduled Meds: . atorvastatin  20 mg Oral QHS  . ceFEPime (MAXIPIME) IV  1 g Intravenous S9Q  . folic acid  1 mg Oral Daily  . heparin  5,000 Units Subcutaneous Q8H  . ipratropium-albuterol  3 mL Nebulization TID  . lacosamide  100 mg Oral BID  . levETIRAcetam  1,000 mg Oral BID  . loratadine  10 mg Oral Daily  . mirtazapine  15 mg Oral QHS  . mometasone-formoterol  2 puff Inhalation BID  . phenytoin  200 mg Oral BID  . pregabalin  150 mg Oral Daily  . senna-docusate  1 tablet Oral Daily  . vancomycin  750 mg Intravenous Q12H  . [START ON 11/09/2013] Vitamin D (Ergocalciferol)  50,000 Units Oral Q30 days   Continuous Infusions: . sodium chloride 50 mL/hr at 10/10/13 2015    Principal Problem:   Healthcare-associated pneumonia Active Problems:   Pleural effusion   HCAP (healthcare-associated pneumonia)    Time spent: 68 min    Verlon Au [place] [facility] Pager [phone]  If 7PM-7AM, please contact night-coverage at [email or web address], password [facility] 10/12/2013, 12:26 PM  LOS: 3 days

## 2013-10-12 NOTE — Progress Notes (Signed)
ANTIBIOTIC CONSULT NOTE - FOLLOW UP  Pharmacy Consult for Vanco Indication: PNA  No Known Allergies  Patient Measurements: Height: 5\' 8"  (172.7 cm) Weight: 131 lb 6.3 oz (59.6 kg) IBW/kg (Calculated) : 68.4 Adjusted Body Weight:   Vital Signs: Temp: 98.9 F (37.2 C) (01/16 0523) Temp src: Oral (01/16 0523) BP: 98/61 mmHg (01/16 0523) Pulse Rate: 95 (01/16 0523) Intake/Output from previous day:   Intake/Output from this shift: Total I/O In: 150 [IV Piggyback:150] Out: -   Labs:  Recent Labs  10/09/13 1645 10/10/13 0353  WBC 7.2 7.7  HGB 13.2 13.0  PLT 126* 126*  CREATININE 0.72 0.71   Estimated Creatinine Clearance: 74.5 ml/min (by C-G formula based on Cr of 0.71). No results found for this basename: VANCOTROUGH, [name], VANCORANDOM, GENTTROUGH, GENTPEAK, GENTRANDOM, TOBRATROUGH, TOBRAPEAK, TOBRARND, AMIKACINPEAK, AMIKACINTROU, AMIKACIN,  in the last 72 hours   Microbiology: Recent Results (from the past 720 hour(s))  MRSA PCR SCREENING     Status: None   Collection Time    10/09/13 10:32 PM      Result Value Range Status   MRSA by PCR NEGATIVE  NEGATIVE Final   Comment:            The GeneXpert MRSA Assay (FDA     approved for NASAL specimens     only), is one component of a     comprehensive MRSA colonization     surveillance program. It is not     intended to diagnose MRSA     infection nor to guide or     monitor treatment for     MRSA infections.  CULTURE, BLOOD (ROUTINE X 2)     Status: None   Collection Time    10/10/13 12:30 AM      Result Value Range Status   Specimen Description BLOOD RIGHT HAND   Final   Special Requests BOTTLES DRAWN AEROBIC ONLY 3CC   Final   Culture  Setup Time     Final   Value: 10/10/2013 12:14     Performed at [facility]   Culture     Final   Value:        BLOOD CULTURE RECEIVED NO GROWTH TO DATE CULTURE WILL BE HELD FOR 5 DAYS BEFORE ISSUING A FINAL NEGATIVE REPORT     Performed at [facility]    Report Status PENDING   Incomplete  CULTURE, BLOOD (ROUTINE X 2)     Status: None   Collection Time    10/10/13 12:35 AM      Result Value Range Status   Specimen Description BLOOD RIGHT WRIST   Final   Special Requests BOTTLES DRAWN AEROBIC ONLY 1CC   Final   Culture  Setup Time     Final   Value: 10/10/2013 12:14     Performed at [facility]   Culture     Final   Value:        BLOOD CULTURE RECEIVED NO GROWTH TO DATE CULTURE WILL BE HELD FOR 5 DAYS BEFORE ISSUING A FINAL NEGATIVE REPORT     Performed at [facility]   Report Status PENDING   Incomplete  BODY FLUID CULTURE     Status: None   Collection Time    10/10/13  1:53 PM      Result Value Range Status   Specimen Description FLUID RIGHT PLEURAL   Final   Special Requests NONE   Final   Gram Stain  Final   Value: RARE WBC PRESENT,BOTH PMN AND MONONUCLEAR     NO ORGANISMS SEEN     Performed at [facility]   Culture     Final   Value: NO GROWTH 1 DAY     Performed at [facility]   Report Status PENDING   Incomplete    Anti-infectives   Start     Dose/Rate Route Frequency Ordered Stop   10/10/13 0800  vancomycin (VANCOCIN) IVPB 750 mg/150 ml premix     750 mg 150 mL/hr over 60 Minutes Intravenous Every 12 hours 10/09/13 2024 10/16/13 1959   10/09/13 2200  ceFEPIme (MAXIPIME) 1 g in dextrose 5 % 50 mL IVPB  Status:  Discontinued     1 g 100 mL/hr over 30 Minutes Intravenous Every 12 hours 10/09/13 1753 10/09/13 2016   10/09/13 2200  ceFEPIme (MAXIPIME) 1 g in dextrose 5 % 50 mL IVPB     1 g 100 mL/hr over 30 Minutes Intravenous 3 times per day 10/09/13 2016 10/17/13 1959   10/09/13 2030  vancomycin (VANCOCIN) IVPB 1000 mg/200 mL premix     1,000 mg 200 mL/hr over 60 Minutes Intravenous  Once 10/09/13 2023 10/09/13 2148   10/09/13 1800  vancomycin (VANCOCIN) IVPB 1000 mg/200 mL premix     1,000 mg 200 mL/hr over 60 Minutes Intravenous  Once 10/09/13 1753 10/09/13 1922       Assessment: 68 y/o M NH resident with h/o lung cancer admitted for HCAP vs. worsening lung cancer.  Anticoagulation: SQ heparin. Plts 126. Watch  Infectious Disease: HCAP. Immunocompromised due to chemo. Tmax 100.7. WBC 7.7. Scr 0.71 Cefepime 1/13>> Vanco 1/13>>  1/14 BC x2: pending 1/14 Pleural fluid: pending  Cardiovascular: HTN, HLD, CAD, carotid dz, PVD, 98/61, HR 90-121. Lipitor  Endocrinology: Gout, Glucose 107  Gastrointestinal / Nutrition: NPO  Neurology:h/o CVA, h/o seizures, anxiety, depression, Vimpat, Keppra, Dilantin (6.6mg /kg/day--see MD ticky note), Lyrica, Remerol  Nephrology: Scr 0.71  Pulmonary: Nebs, Claritin, Dulera. Large right-sided pleural effusion with hypoxemia--1/14 thoracentesis removed 1.4L. Meds: Duonebs, Claritin, [place]  Hematology / Oncology: h/o metastatic lung cancer (NSCLC, adenocarcinoma) undergoing chemo and prostate cancer.Hgb 13. Plts 126  PTA Medication Issues  Goal of Therapy:  Vancomycin trough level 15-20 mcg/ml  Plan:  Continue cefepime 1g IV q8h. Vancomycin 750mg  IV q12. Vanco trough this PM    [name], PharmD, [facility] Clinical Staff Pharmacist Pager [phone]  [name] 10/12/2013,10:44 AM

## 2013-10-12 NOTE — Evaluation (Signed)
Clinical/Bedside Swallow Evaluation Patient Details  Name: [name] MRN: [id number] Date of Birth: November 15, 1945  Today's Date: 10/12/2013 Time: 7169-6789 SLP Time Calculation (min): 16 min  Past Medical History:  Past Medical History  Diagnosis Date  . Prostate cancer 04/22/09    Adenomacarcinoma,glleason=#=#=6,volume=32.9cc,PSA=5.91  . Gout     hx  . Myocardial infarction   . Anxiety   . Depression   . PVD (peripheral vascular disease)   . Stroke     hx syndrome  . Seizures   . Hypertension   . Hypercholesterolemia   . History of radiation therapy 08/25/2009-10/28/2009    prostate 78 Gy/40 fractions  . Lung cancer 08/29/12    bx= right  upper outer lobe lung=Adenocarcinoma  . Anemia   . CAD (coronary artery disease)   . Gangrene     hx  . Hemiparesis     left , and contracture lue  . Sinus tachycardia     hx  . Seizure 10/03/2012  . CAD (coronary artery disease) 10/03/2012  . HTN (hypertension) 10/03/2012  . Stroke 10/03/2012  . Allergic rhinitis, cause unspecified 05/17/2013  . History of radiation therapy 10/25/12-12/14/12    lung 66Gy   Past Surgical History:  Past Surgical History  Procedure Laterality Date  . 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 Lung biopsy  08/29/12    RUO lung=Adenocarcinoma  . Prostate biopsy  04/22/09    Adenocarcinoma   HPI:  [name]  is a 68 y.o. male, nursing home resident, with history of non-small lung cancer who was sent here for evaluation of hypoxemia noted in the nursing home. Chest x-ray showed pleural effusion and the patient was found to be febrile.   Assessment / Plan / Recommendation Clinical Impression  Pt demonstrates wet vocal quality after being given meds in puree with RN. No overt signs of sensed aspiration with thin liquids. Voice not overtly wet, but also dysphonic (difficult to hear vocal quality). Pt does have consistent wet vocal quality with puree, but this clear when pt  is asked to swallow twice. Suspect pharyngeal residuals. Oral phase is also severely impaired by constant tongue pumping behavior. Recommend dys 1/thin with upright posture and cues to swallow twice. There is a risk of silent aspiration in this pt. Pt would need MBS to further evaluate. Would not recommend further testing unless an aggressive medical course is desired by family. For now will attempt to decrease risk and facilitate intake with strategies. MD, if MBS is desired, please order.    Aspiration Risk  Moderate    Diet Recommendation Dysphagia 1 (Puree);Thin liquid   Liquid Administration via: Cup;Straw Medication Administration: Whole meds with puree Supervision: Full supervision/cueing for compensatory strategies Compensations: Slow rate;Small sips/bites;Multiple dry swallows after each bite/sip Postural Changes and/or Swallow Maneuvers: Seated upright 90 degrees;Upright 30-60 min after meal    Other  Recommendations Recommended Consults: MBS Oral Care Recommendations: Oral care BID   Follow Up Recommendations  Skilled Nursing facility    Frequency and Duration min 2x/week  2 weeks   Pertinent Vitals/Pain NA    SLP Swallow Goals     Swallow Study Prior Functional Status       General HPI: [name]  is a 68 y.o. male, nursing home resident, with history of non-small lung cancer who was sent here for evaluation of hypoxemia noted in the nursing home. Chest x-ray showed pleural effusion and the patient was found  to be febrile. Type of Study: Bedside swallow evaluation Diet Prior to this Study: Regular;Thin liquids Temperature Spikes Noted: No Respiratory Status: Room air History of Recent Intubation: No Behavior/Cognition: Alert;Confused Oral Cavity - Dentition: Edentulous Self-Feeding Abilities: Total assist Patient Positioning: Upright in bed Baseline Vocal Quality: Wet Volitional Cough: Strong Volitional Swallow: Able to elicit    Oral/Motor/Sensory  Function Overall Oral Motor/Sensory Function: Impaired (constant tongue pumping. doesnt follow commands)   Ice Chips     Thin Liquid Thin Liquid: Within functional limits    Nectar Thick Nectar Thick Liquid: Not tested   Honey Thick Honey Thick Liquid: Not tested   Puree Puree: Impaired Presentation: Spoon Pharyngeal Phase Impairments: Wet Vocal Quality   Solid   GO    Solid: Not tested      Philip [place], MA CCC-SLP [phone]  [name], [name] 10/12/2013,1:25 PM

## 2013-10-12 NOTE — Progress Notes (Signed)
Temp 101.4, tylenol given. MD notified. No new orders received.  [name], [name]

## 2013-10-12 NOTE — Progress Notes (Addendum)
IR aware of request for Pleural drain catheter. Pt has history of lung cancer. Admitted for possible PNA vs progression of lung cancer. Large pleural effusion seen on imgaing. Had [place] thora on 1/14, 1.4L removed. Cyto pending Cx negative so far.  BP 105/70  Pulse 92  Temp(Src) 101.4 F (38.6 C) (Oral)  Resp 18  Ht 5\' 8"  (1.727 m)  Wt 131 lb 6.3 oz (59.6 kg)  BMI 19.98 kg/m2  SpO2 100%  Pt status overall some better. No respiratory distress. Effusion has returned on CXR. Pt is disoriented and unable to make decisions. IR requested to eval for possible pleural drainage cathater. D/w Dr. Anselm Pancoast, we would typically do at least 2 Thoracentesis prior to placing tunneled drainage catheter, but if pt is going to be discharged to Hospice care, then procedure could be considered. Pt oncologist to evaluate, though seems agreeable to idea of pleural drain as well. Would also recommend Palliative consult for [place] discussion with family to confirm Hospice is desired. IR will follow.   Ascencion Dike PA-C Interventional Radiology 10/12/2013 4:26 PM

## 2013-10-12 NOTE — Significant Event (Signed)
Spoke to family Not sure how to proceed, but agreeable to drain placement by IR Will consult Hospice and palliative care to delineate goals further  Verneita Griffes, MD [facility] [phone]

## 2013-10-13 ENCOUNTER — Other Ambulatory Visit: Payer: Self-pay | Admitting: Internal Medicine

## 2013-10-13 DIAGNOSIS — J811 Chronic pulmonary edema: Secondary | ICD-10-CM

## 2013-10-13 LAB — BODY FLUID CULTURE: Culture: NO GROWTH

## 2013-10-13 NOTE — Progress Notes (Signed)
[facility] PROGRESS NOTE  [name] XBW:[id number] DOB: March 23, 1946 DOA: 10/09/2013 PCP: No primary provider on file.  Assessment/Plan: Metastatic NSCLC -With disease progression. -Is s/p right thoracentesis with re-accumulation of fluid. -Strongly support palliative care and hospice as well. Consult has been placed. -May need to consider pleurex catheter for hospice and comfort.  Postobstructive PNA -Continue Levaquin.  Code Status: DNR Family Communication: None today  Disposition Plan: For [place]. Believe he would greatly benefit from hospice care.   Consultants:  IR  Oncology   Antibiotics:  Levaquin   Subjective: No complaints.  Objective: Filed Vitals:   10/12/13 2140 10/13/13 0459 10/13/13 0804 10/13/13 1409  BP: 116/57 118/57 98/65 106/60  Pulse: 132 106 103 69  Temp: 98.6 F (37 C) 98.4 F (36.9 C) 98.8 F (37.1 C) 98.5 F (36.9 C)  TempSrc: Oral Oral Oral Oral  Resp: 18 20 20 20   Height:      Weight:  55.7 kg (122 lb 12.7 oz)    SpO2: 95% 98% 100% 98%    Intake/Output Summary (Last 24 hours) at 10/13/13 1628 Last data filed at 10/13/13 1441  Gross per 24 hour  Intake 955.83 ml  Output   2200 ml  Net -1244.17 ml   Filed Weights   10/10/13 2105 10/11/13 2025 10/13/13 0459  Weight: 59.6 kg (131 lb 6.3 oz) 59.6 kg (131 lb 6.3 oz) 55.7 kg (122 lb 12.7 oz)    Exam:   General:  awake  Cardiovascular: RRR  Respiratory: Decreased breath sounds right side.  Abdomen: S/ND/+BS  Extremities: 1+edema   Neurologic:  Moves all 4 spontaneuosly  Data Reviewed: Basic Metabolic Panel:  Recent Labs Lab 10/09/13 1645 10/10/13 0353  NA 138 137  K 5.4* 3.7  CL 102 101  CO2 25 21  GLUCOSE 101* 107*  BUN 9 8  CREATININE 0.72 0.71  CALCIUM 8.3* 8.6   Liver Function Tests: No results found for this basename: AST, ALT, ALKPHOS, BILITOT, PROT, ALBUMIN,  in the last 168 hours No results found for this basename: LIPASE, AMYLASE,  in  the last 168 hours No results found for this basename: AMMONIA,  in the last 168 hours CBC:  Recent Labs Lab 10/09/13 1645 10/10/13 0353  WBC 7.2 7.7  NEUTROABS 5.5  --   HGB 13.2 13.0  HCT 38.6* 34.0*  MCV 101.3* 98.6  PLT 126* 126*   Cardiac Enzymes: No results found for this basename: CKTOTAL, CKMB, CKMBINDEX, TROPONINI,  in the last 168 hours BNP (last 3 results)  Recent Labs  10/09/13 1730  PROBNP 234.6*   CBG: No results found for this basename: GLUCAP,  in the last 168 hours  Recent Results (from the past 240 hour(s))  MRSA PCR SCREENING     Status: None   Collection Time    10/09/13 10:32 PM      Result Value Range Status   MRSA by PCR NEGATIVE  NEGATIVE Final   Comment:            The GeneXpert MRSA Assay (FDA     approved for NASAL specimens     only), is one component of a     comprehensive MRSA colonization     surveillance program. It is not     intended to diagnose MRSA     infection nor to guide or     monitor treatment for     MRSA infections.  CULTURE, BLOOD (ROUTINE X 2)     Status: None  Collection Time    10/10/13 12:30 AM      Result Value Range Status   Specimen Description BLOOD RIGHT HAND   Final   Special Requests BOTTLES DRAWN AEROBIC ONLY 3CC   Final   Culture  Setup Time     Final   Value: 10/10/2013 12:14     Performed at [facility]   Culture     Final   Value:        BLOOD CULTURE RECEIVED NO GROWTH TO DATE CULTURE WILL BE HELD FOR 5 DAYS BEFORE ISSUING A FINAL NEGATIVE REPORT     Performed at [facility]   Report Status PENDING   Incomplete  CULTURE, BLOOD (ROUTINE X 2)     Status: None   Collection Time    10/10/13 12:35 AM      Result Value Range Status   Specimen Description BLOOD RIGHT WRIST   Final   Special Requests BOTTLES DRAWN AEROBIC ONLY 1CC   Final   Culture  Setup Time     Final   Value: 10/10/2013 12:14     Performed at [facility]   Culture     Final   Value:        BLOOD  CULTURE RECEIVED NO GROWTH TO DATE CULTURE WILL BE HELD FOR 5 DAYS BEFORE ISSUING A FINAL NEGATIVE REPORT     Performed at [facility]   Report Status PENDING   Incomplete  BODY FLUID CULTURE     Status: None   Collection Time    10/10/13  1:53 PM      Result Value Range Status   Specimen Description FLUID RIGHT PLEURAL   Final   Special Requests NONE   Final   Gram Stain     Final   Value: RARE WBC PRESENT,BOTH PMN AND MONONUCLEAR     NO ORGANISMS SEEN     Performed at [facility]   Culture     Final   Value: NO GROWTH 3 DAYS     Performed at [facility]   Report Status 10/13/2013 FINAL   Final     Studies: Dg Chest Port 1 View  10/12/2013   CLINICAL DATA:  Shortness of breath.  Effusion.  EXAM: PORTABLE CHEST - 1 VIEW  COMPARISON:  10/10/2013.  FINDINGS: Patient is rotated to the right. Port-A-Cath in stable position. Progressive right pleural effusion noted. Underlying atelectasis and/or infiltrate cannot be excluded. There is near opacification of the right hemithorax. Prior CABG. Heart size cannot be evaluated due to opacification of right in the thorax. There is mild pulmonary venous congestion and pulmonary interstitial prominence on the left. A component of congestive heart failure may be present. No pneumothorax. No acute osseous abnormality.  IMPRESSION: 1. Near complete opacification right hemithorax consistent with progressive right pleural effusion. Underlying infiltrate and atelectasis may be present. 2. Pulmonary venous congestion with interstitial prominence on the left noted. A component of congestive heart failure may be present.   Electronically Signed   By: Marcello Moores  Register   On: 10/12/2013 07:36    Scheduled Meds: . atorvastatin  20 mg Oral QHS  . ceFEPime (MAXIPIME) IV  1 g Intravenous C1Y  . folic acid  1 mg Oral Daily  . heparin  5,000 Units Subcutaneous Q8H  . ipratropium-albuterol  3 mL Nebulization TID  . lacosamide  100 mg Oral BID   . levETIRAcetam  1,000 mg Oral BID  . loratadine  10 mg Oral Daily  . mirtazapine  15 mg Oral QHS  . mometasone-formoterol  2 puff Inhalation BID  . phenytoin  200 mg Oral BID  . pregabalin  150 mg Oral Daily  . senna-docusate  1 tablet Oral Daily  . vancomycin  750 mg Intravenous Q12H  . [START ON 11/09/2013] Vitamin D (Ergocalciferol)  50,000 Units Oral Q30 days   Continuous Infusions: . sodium chloride 50 mL/hr at 10/12/13 1824    Principal Problem:   Healthcare-associated pneumonia Active Problems:   Pleural effusion   HCAP (healthcare-associated pneumonia)    Time spent: 25 minutes. Greater than 50% of this time was spent in direct contact with the patient coordinating care.    Lelon Frohlich  [facility] Pager [phone]  If 7PM-7AM, please contact night-coverage at [email or web address], password [facility] 10/13/2013, 4:28 PM  LOS: 4 days

## 2013-10-13 NOTE — Progress Notes (Signed)
DIAGNOSIS: Metastatic non-small cell lung cancer initially diagnosed as stage IIIA (T1a, N2, M0) non-small cell lung cancer, adenocarcinoma diagnosed in December of 2013.   PRIOR THERAPY:  1) concurrent chemoradiation with weekly carboplatin for AUC of 2 and paclitaxel 45 mg/M2, status post 4 weekly doses of chemotherapy last dose was given on 11/06/2012 and last fraction of radiotherapy 11/07/2012.  2) Systemic chemotherapy with carboplatin for AUC of 5 and Alimta 500 mg/M2 every 3 weeks. First dose given on 06/05/2013. Status post 3 cycles discontinued secondary to disease progression.  CURRENT THERAPY: one  CHEMOTHERAPY INTENT: Palliative  CURRENT # OF CHEMOTHERAPY CYCLES: 0 CURRENT ANTIEMETICS: Zofran, dexamethasone and Compazine  CURRENT SMOKING STATUS: Former smoker  ORAL CHEMOTHERAPY AND CONSENT: None  CURRENT BISPHOSPHONATES USE: None  PAIN MANAGEMENT: 0/10  NARCOTICS INDUCED CONSTIPATION: None  LIVING WILL AND CODE STATUS: Full code   Subjective: The patient is see and examined today. He is very weak. His conditioned is very poor. He was lost to follow up for the last 2 months. He is admitted with worsening dyspnea and right pleural effusion s/p right thoracentesis. He now has near complete opacification right hemithorax consistent with progressive right pleural effusion. Underlying infiltrate and atelectasis may be present as well as pulmonary congestion.    Objective: Vital signs in last 24 hours: Temp:  [98.3 F (36.8 C)-101.4 F (38.6 C)] 98.8 F (37.1 C) (01/17 0804) Pulse Rate:  [92-132] 103 (01/17 0804) Resp:  [16-20] 20 (01/17 0804) BP: (98-134)/(57-70) 98/65 mmHg (01/17 0804) SpO2:  [95 %-100 %] 100 % (01/17 0804) Weight:  [122 lb 12.7 oz (55.7 kg)] 122 lb 12.7 oz (55.7 kg) (01/17 0459)  Intake/Output from previous day: 01/16 0701 - 01/17 0700 In: 1395.8 [P.O.:600; I.V.:595.8; IV Piggyback:200] Out: 1200 [Urine:1200] Intake/Output this shift:    General  appearance: alert, distracted, fatigued, mild distress and slowed mentation Resp: diminished breath sounds RLL and RML and dullness to percussion RLL and RML Cardio: regular rate and rhythm, S1, S2 normal, no murmur, click, rub or gallop GI: soft, non-tender; bowel sounds normal; no masses,  no organomegaly Extremities: extremities normal, atraumatic, no cyanosis or edema  Lab Results:  No results found for this basename: WBC, HGB, HCT, PLT,  in the last 72 hours BMET No results found for this basename: NA, K, CL, CO2, GLUCOSE, BUN, CREATININE, CALCIUM,  in the last 72 hours  Studies/Results: Dg Chest Port 1 View  10/12/2013   CLINICAL DATA:  Shortness of breath.  Effusion.  EXAM: PORTABLE CHEST - 1 VIEW  COMPARISON:  10/10/2013.  FINDINGS: Patient is rotated to the right. Port-A-Cath in stable position. Progressive right pleural effusion noted. Underlying atelectasis and/or infiltrate cannot be excluded. There is near opacification of the right hemithorax. Prior CABG. Heart size cannot be evaluated due to opacification of right in the thorax. There is mild pulmonary venous congestion and pulmonary interstitial prominence on the left. A component of congestive heart failure may be present. No pneumothorax. No acute osseous abnormality.  IMPRESSION: 1. Near complete opacification right hemithorax consistent with progressive right pleural effusion. Underlying infiltrate and atelectasis may be present. 2. Pulmonary venous congestion with interstitial prominence on the left noted. A component of congestive heart failure may be present.   Electronically Signed   By: Marcello Moores  Register   On: 10/12/2013 07:36    Medications: I have reviewed the patient's current medications.  Assessment/Plan: 1) Metastatic NSCLC with disease progression in November of 2014. He was interested in further chemotherapy  but was lost to follow up and did not show up for his treatment. He is currently not a good candidate for  systemic therapy.  I strongly recommend palliative care and hospice at this point. 2) Dyspnea: secondary to progressive NSCLC as well as pleural effusion and now probably pulmonary edema.  Continue supportive care with diuresis. Consider pleurX catheter placement for recurrent pleural effusion for comfort care. Thank you for taking good care of Mr. Biggar. Please call if you have any questions.   LOS: 4 days    Jovanie Verge K. 10/13/2013

## 2013-10-14 ENCOUNTER — Inpatient Hospital Stay (HOSPITAL_COMMUNITY): Payer: PRIVATE HEALTH INSURANCE

## 2013-10-14 DIAGNOSIS — I10 Essential (primary) hypertension: Secondary | ICD-10-CM

## 2013-10-14 DIAGNOSIS — I2510 Atherosclerotic heart disease of native coronary artery without angina pectoris: Secondary | ICD-10-CM

## 2013-10-14 LAB — GLUCOSE, CAPILLARY: Glucose-Capillary: 138 mg/dL — ABNORMAL HIGH (ref 70–99)

## 2013-10-14 MED ORDER — SODIUM CHLORIDE 0.9 % IV SOLN
190.0000 mg | Freq: Two times a day (BID) | INTRAVENOUS | Status: DC
  Administered 2013-10-14 – 2013-10-17 (×6): 190 mg via INTRAVENOUS
  Filled 2013-10-14 (×12): qty 3.8

## 2013-10-14 MED ORDER — MORPHINE SULFATE 2 MG/ML IJ SOLN
2.0000 mg | INTRAMUSCULAR | Status: DC | PRN
  Administered 2013-10-14 – 2013-10-15 (×3): 2 mg via INTRAVENOUS
  Filled 2013-10-14 (×2): qty 1

## 2013-10-14 MED ORDER — MORPHINE SULFATE 2 MG/ML IJ SOLN
INTRAMUSCULAR | Status: AC
  Administered 2013-10-14: 2 mg via INTRAVENOUS
  Filled 2013-10-14: qty 1

## 2013-10-14 MED ORDER — SODIUM CHLORIDE 0.9 % IV SOLN
1000.0000 mg | Freq: Two times a day (BID) | INTRAVENOUS | Status: DC
  Administered 2013-10-14 – 2013-10-17 (×6): 1000 mg via INTRAVENOUS
  Filled 2013-10-14 (×8): qty 10

## 2013-10-14 NOTE — Progress Notes (Signed)
[facility] PROGRESS NOTE  Kathan Dudding LZJ:[id number] DOB: 1946-06-04 DOA: 10/09/2013 PCP: No primary provider on file.  Assessment/Plan: Metastatic NSCLC -With disease progression. -Is s/p right thoracentesis with re-accumulation of fluid. -Strongly support palliative care and hospice as well. Consult has been placed. -May need to consider pleurex catheter for hospice and comfort.  Postobstructive PNA -Continue Levaquin.  Code Status: DNR Family Communication: None today  Disposition Plan: For [place]. Believe he would greatly benefit from hospice care.   Consultants:  IR  Oncology   Antibiotics:  Levaquin   Subjective: No complaints.  Objective: Filed Vitals:   10/13/13 2112 10/14/13 0427 10/14/13 0745 10/14/13 0901  BP: 108/71 73/53 95/57    Pulse: 100 50 59   Temp: 99.3 F (37.4 C) 99.5 F (37.5 C) 99.4 F (37.4 C)   TempSrc: Oral Oral Oral   Resp: 20 18 18    Height:      Weight: 56.155 kg (123 lb 12.8 oz)     SpO2: 99% 96% 96% 96%    Intake/Output Summary (Last 24 hours) at 10/14/13 1406 Last data filed at 10/14/13 0840  Gross per 24 hour  Intake 2154.17 ml  Output   2475 ml  Net -320.83 ml   Filed Weights   10/11/13 2025 10/13/13 0459 10/13/13 2112  Weight: 59.6 kg (131 lb 6.3 oz) 55.7 kg (122 lb 12.7 oz) 56.155 kg (123 lb 12.8 oz)    Exam:   General:  awake  Cardiovascular: RRR  Respiratory: Decreased breath sounds right side.  Abdomen: S/ND/+BS  Extremities: no C/C/E  Neurologic:  Moves all 4 spontaneuosly  Data Reviewed: Basic Metabolic Panel:  Recent Labs Lab 10/09/13 1645 10/10/13 0353  NA 138 137  K 5.4* 3.7  CL 102 101  CO2 25 21  GLUCOSE 101* 107*  BUN 9 8  CREATININE 0.72 0.71  CALCIUM 8.3* 8.6   Liver Function Tests: No results found for this basename: AST, ALT, ALKPHOS, BILITOT, PROT, ALBUMIN,  in the last 168 hours No results found for this basename: LIPASE, AMYLASE,  in the last 168 hours No  results found for this basename: AMMONIA,  in the last 168 hours CBC:  Recent Labs Lab 10/09/13 1645 10/10/13 0353  WBC 7.2 7.7  NEUTROABS 5.5  --   HGB 13.2 13.0  HCT 38.6* 34.0*  MCV 101.3* 98.6  PLT 126* 126*   Cardiac Enzymes: No results found for this basename: CKTOTAL, CKMB, CKMBINDEX, TROPONINI,  in the last 168 hours BNP (last 3 results)  Recent Labs  10/09/13 1730  PROBNP 234.6*   CBG: No results found for this basename: GLUCAP,  in the last 168 hours  Recent Results (from the past 240 hour(s))  MRSA PCR SCREENING     Status: None   Collection Time    10/09/13 10:32 PM      Result Value Range Status   MRSA by PCR NEGATIVE  NEGATIVE Final   Comment:            The GeneXpert MRSA Assay (FDA     approved for NASAL specimens     only), is one component of a     comprehensive MRSA colonization     surveillance program. It is not     intended to diagnose MRSA     infection nor to guide or     monitor treatment for     MRSA infections.  CULTURE, BLOOD (ROUTINE X 2)     Status: None   Collection  Time    10/10/13 12:30 AM      Result Value Range Status   Specimen Description BLOOD RIGHT HAND   Final   Special Requests BOTTLES DRAWN AEROBIC ONLY 3CC   Final   Culture  Setup Time     Final   Value: 10/10/2013 12:14     Performed at [facility]   Culture     Final   Value:        BLOOD CULTURE RECEIVED NO GROWTH TO DATE CULTURE WILL BE HELD FOR 5 DAYS BEFORE ISSUING A FINAL NEGATIVE REPORT     Performed at [facility]   Report Status PENDING   Incomplete  CULTURE, BLOOD (ROUTINE X 2)     Status: None   Collection Time    10/10/13 12:35 AM      Result Value Range Status   Specimen Description BLOOD RIGHT WRIST   Final   Special Requests BOTTLES DRAWN AEROBIC ONLY 1CC   Final   Culture  Setup Time     Final   Value: 10/10/2013 12:14     Performed at [facility]   Culture     Final   Value:        BLOOD CULTURE RECEIVED NO GROWTH  TO DATE CULTURE WILL BE HELD FOR 5 DAYS BEFORE ISSUING A FINAL NEGATIVE REPORT     Performed at [facility]   Report Status PENDING   Incomplete  BODY FLUID CULTURE     Status: None   Collection Time    10/10/13  1:53 PM      Result Value Range Status   Specimen Description FLUID RIGHT PLEURAL   Final   Special Requests NONE   Final   Gram Stain     Final   Value: RARE WBC PRESENT,BOTH PMN AND MONONUCLEAR     NO ORGANISMS SEEN     Performed at [facility]   Culture     Final   Value: NO GROWTH 3 DAYS     Performed at [facility]   Report Status 10/13/2013 FINAL   Final     Studies: No results found.  Scheduled Meds: . atorvastatin  20 mg Oral QHS  . ceFEPime (MAXIPIME) IV  1 g Intravenous Y4I  . folic acid  1 mg Oral Daily  . heparin  5,000 Units Subcutaneous Q8H  . ipratropium-albuterol  3 mL Nebulization TID  . lacosamide  100 mg Oral BID  . levETIRAcetam  1,000 mg Oral BID  . loratadine  10 mg Oral Daily  . mirtazapine  15 mg Oral QHS  . mometasone-formoterol  2 puff Inhalation BID  . phenytoin  200 mg Oral BID  . pregabalin  150 mg Oral Daily  . senna-docusate  1 tablet Oral Daily  . vancomycin  750 mg Intravenous Q12H  . [START ON 11/09/2013] Vitamin D (Ergocalciferol)  50,000 Units Oral Q30 days   Continuous Infusions: . sodium chloride 50 mL/hr at 10/13/13 1638    Principal Problem:   Healthcare-associated pneumonia Active Problems:   Pleural effusion   HCAP (healthcare-associated pneumonia)    Time spent: 25 minutes. Greater than 50% of this time was spent in direct contact with the patient coordinating care.    Lelon Frohlich  [facility] Pager [phone]  If 7PM-7AM, please contact night-coverage at [email or web address], password [facility] 10/14/2013, 2:06 PM  LOS: 5 days

## 2013-10-14 NOTE — Progress Notes (Signed)
10/14/13 1850 nsg Patient started coughing after 1 bite of his dinner per NT; was asked to suction patient got white thick food  from his mouth. Patient started to sound rhonchi on both lungs. Respiratory therapist called for breathing treatment but did not help much. RT placed patient on non rebreather at 15L 02 sat went up to 98%. Dr. Jerilee Hoh notified orders noted. Reported to incoming RN.

## 2013-10-15 DIAGNOSIS — G40909 Epilepsy, unspecified, not intractable, without status epilepticus: Secondary | ICD-10-CM | POA: Diagnosis present

## 2013-10-15 DIAGNOSIS — Z515 Encounter for palliative care: Secondary | ICD-10-CM

## 2013-10-15 DIAGNOSIS — Z66 Do not resuscitate: Secondary | ICD-10-CM | POA: Diagnosis present

## 2013-10-15 LAB — STREP PNEUMONIAE URINARY ANTIGEN: STREP PNEUMO URINARY ANTIGEN: NEGATIVE

## 2013-10-15 MED ORDER — MORPHINE SULFATE (CONCENTRATE) 10 MG /0.5 ML PO SOLN
5.0000 mg | Freq: Four times a day (QID) | ORAL | Status: DC
  Administered 2013-10-15 – 2013-10-17 (×9): 5 mg via ORAL
  Filled 2013-10-15 (×10): qty 0.5

## 2013-10-15 MED ORDER — LORAZEPAM 2 MG/ML IJ SOLN: 1.0000 mg | INTRAMUSCULAR | Status: DC | PRN

## 2013-10-15 MED ORDER — PREGABALIN 50 MG PO CAPS
100.0000 mg | ORAL_CAPSULE | Freq: Every day | ORAL | Status: DC
  Administered 2013-10-15 – 2013-10-17 (×2): 100 mg via ORAL
  Filled 2013-10-15 (×2): qty 2

## 2013-10-15 NOTE — Consult Note (Addendum)
Palliative Medicine Team at [facility]  Date: 10/15/2013   Patient Name: [name]  DOB: 1946/08/07  MRN: [id number]  Age / Sex: 68 y.o., male   PCP: No primary provider on file. Referring Physician: Koleen Nimrod Acost*  HPI/Reason for Consultation: 68 yo man originally from [place], [place] with metastatic lung cancer diagnosed in Dec 2013. Progression of disease despite chemotherapy. Large malignant effusion. Has been at [facility] where he was lost to cancer center follow up for the last 2 months.  Participants in Discussion: I called his sister Joycelyn Schmid who supposedly has HCPOA-she could barely speak to me on the phone-she was unable to ask or answer questions, she told me to call his brothers- she sounded very ill and weak over the phone.  I was able to get in touch with Sharyne Richters who told me he was one of the "close brothers" and able to pass information along, Jeneen Rinks not available until late in the afternoon because of his job.  Goals/Summary of Discussion:  I had an extensive conversation with Evelena Peat the patient's brother, Evelena Peat did not know Iban had lung cancer-he was very adamant that he did not think even Alexandar knew he had lung cancer and he tells me he is very shocked and saddened by this news and the prognosis. Daiel can only tell me his name this AM, he is agitated and distress on my evaluation. He is tangled in his NRB mask and pulse ox monitor. His prognosis is very poor.  1. Code Status:  DNR, previously determined by patient  2. Scope of Treatment:  Comfort Care  No additional systemic chemotherapy per Dr. Earlie Server  Plans for a Pleurx drain- effusion re-occurred in 3 days after thoracentesis- family unable to weigh pros and cons of pleurx placement-from a comfort standpoint it may or may not be helpful-I think his cancer is so advanced that he will have a rapid decline and require opiates for dyspnea with or without a pleural  drain.  De-escalate medications- stop abx-this is mostly effusion/malignancy and not PNA  Stop unecessary medications  Start Scheduled roxanol and ativan for agitation  He is on high doses of seizure medication IV  Comfort Feeding 3. Assessment/Plan:  Primary Diagnoses  1. NSCLC, metastatic 2. Malignant effusion 3. Seizure disorder, severe post stroke 4. Prior CVA 5. Prostate Cancer 6. Delirium 7. AKA left leg   Start scheduled low dose Roxanol SL, use IV morphine for breakthrough dyspnea and pain  Start Ativan for agitation  Maintain 3 drug regimen for seizures for now- unclear details on how he got on a three drug regimen or when his last seizure happened.  Comfort feeding- no need to limit diet or have extensive evaluation  At this time I am not sure a Pleurx would help him significantly from a comfort standpoint- he didn't dramatically improve after the first thoracentesis based on my review of the records.  Maintain nasal cannula oxygen-no need for NRB  Unclear why he is on Lyrica- I think opiates are most appropriate for his symptom management at this point-will start weaning him off of this.  Prognosis: <2 weeks strongly suspected given acute decline in hospital  PPS 30  Active Symptoms 1. Dyspnea 2. Dysphagia 3. Pain, due to mets 4. Agitation/Delirium 5. Seizure risk  4. Palliative Prophylaxis:   Bowel Regimen -yes  Terminal Secretions-yes  Breakthrough Pain and Dyspnea - yes  Agitation and Delirium-yes  Nausea-yes  5. Psychosocial Spiritual Asssessment/Interventions:  Patient and Family  Adjustment to Illness/Prognosis:  Very low health literacy. Brother seems to understand how serious this is and endorses comfort care.  Spiritual Concerns or Needs: will ask chaplain to see  6. Disposition: Hospice facility would be appropriate.  Scheduled Medications: . ipratropium-albuterol  3 mL Nebulization TID  . lacosamide  100 mg Oral BID  .  levETIRAcetam  1,000 mg Intravenous Q12H  . mirtazapine  15 mg Oral QHS  . mometasone-formoterol  2 puff Inhalation BID  . phenytoin (DILANTIN) IV  190 mg Intravenous BID  . pregabalin  150 mg Oral Daily  . senna-docusate  1 tablet Oral Daily    PRN Medications: acetaminophen, acetaminophen, albuterol, morphine injection, ondansetron (ZOFRAN) IV, ondansetron, polyvinyl alcohol   Vital Signs: BP 104/64  Pulse 124  Temp(Src) 98.7 F (37.1 C) (Oral)  Resp 22  Ht 5\' 8"  (1.727 m)  Wt 55.747 kg (122 lb 14.4 oz)  BMI 18.69 kg/m2  SpO2 94%   Physical Exam:  General appearance: Mild distress-tangled in his cords, unable to speak in sentences Eyes: anicteric sclerae, moist conjunctivae; no lid-lag; PERRLA HENT: Atraumatic; oropharynx dry Neck: +lymphadenopthy Lungs: Minimal BS on Right, scattered crackles, increased WOB evident CV: tachy Abdomen: Soft, non-tender; no masses or HSM Extremities: left AKA Skin: dry, no obvious ulcers or lesions Psych: Confused, only oriented to person  Imaging:  Dg Chest Port 1 View  10/14/2013   CLINICAL DATA:  ronchi, pneumonia  EXAM: PORTABLE CHEST - 1 VIEW  COMPARISON:  DG CHEST 1V PORT dated 10/12/2013  FINDINGS: Right power port in place. There is complete opacification of the right hemi thorax unchanged from prior. The cardiac silhouette is partially obscured but appears unchanged from prior. Left lung is clear.  IMPRESSION: 1. No interval change. 2. Complete opacification right hemi thorax   Electronically Signed   By: Suzy Bouchard M.D.   On: 10/14/2013 20:20   Provided extensive education over the phone to brother regarding EOL decisions and comfort care options.  Time: 70 minutes Greater than 50%  of this time was spent counseling and coordinating care related to the above assessment and plan.  Signed by: Acquanetta Chain, DO  10/15/2013, 9:20 AM  Please contact Palliative Medicine Team phone at [phone] for questions and concerns.

## 2013-10-15 NOTE — Clinical Social Work Psychosocial (Addendum)
Clinical Social Work Department BRIEF PSYCHOSOCIAL ASSESSMENT 10/15/2013  Patient:  [name], [name]     Account Number:  [id number]     Admit date:  10/09/2013  Clinical Social Worker:  Frederico Hamman  Date/Time:  10/15/2013 01:51 AM  Referred by:  Physician  Date Referred:  10/15/2013 Referred for  Residential hospice placement   Other Referral:   Interview type:  Family Other interview type:   CSW talked with patient's brother Leamon Palau [phone])    PSYCHOSOCIAL DATA Living Status:  FACILITY Admitted from facility:  [facility] Level of care:   Primary support name:  Locklear,Margaret Primary support relationship to patient:  SIBLING Degree of support available:   CSW was able to talk with sister/POA on 1/14; however Dr. Hilma Favors was unable to have a conversation with sister due to health issues and talked with brother Leven Hoel. Family appears caring and supportive of patient.    CURRENT CONCERNS Current Concerns  Post-Acute Placement   Other Concerns:    SOCIAL WORK ASSESSMENT / PLAN CSW talked with brother, Nazaire Cordial by phone regarding discharge plans and MD's recommendation of residential hospice. Brother recounted conversation with Dr. Hilma Favors and reported that family in agreement with residential hospice and their preference is [facility]. When asked for a second choice, CSW provided brother with other hospice facilities nearby and he chose [facility]. Brother reported that he and brother Jeneen Rinks are in [place] and the rest of the family is in [place], [place].   Assessment/plan status:  Psychosocial Support/Ongoing Assessment of Needs Other assessment/ plan:   Information/referral to community resources:   **Brother provided with other nearby hospice facilities by phone. **Call made to Erling Conte, CSW with [facility] to make referral.    PATIENT'S/FAMILY'S RESPONSE TO PLAN OF CARE: Brother receptive to talking with CSW about  discharge plans and indicated that family is in agreement with residential hospice.

## 2013-10-15 NOTE — Progress Notes (Signed)
Nutrition Brief Note  Chart reviewed. Pt now transitioning to comfort feedings. No further nutrition interventions warranted at this time.  Please re-consult as needed.   Inda Coke MS, RD, LDN Pager: [phone] After-hours pager: [phone]

## 2013-10-15 NOTE — Progress Notes (Signed)
[facility] PROGRESS NOTE  Negan Sabia OEV:[id number] DOB: [birth year] DOA: 10/09/2013 PCP: No primary provider on file.  Assessment/Plan: Metastatic NSCLC -With disease progression. -Is s/p right thoracentesis with re-accumulation of fluid. -Strongly support palliative care and hospice as well. Consult has been placed. -May need to consider pleurex catheter for hospice and comfort.  Postobstructive PNA -Continue Levaquin.  Code Status: DNR Family Communication: None today  Disposition Plan: Appreciate palliative consultation. Plan for Residential Hospice. SW has been alerted.   Consultants:  IR  Oncology   Antibiotics:  None  Subjective: No complaints.  Objective: Filed Vitals:   10/14/13 1850 10/14/13 2106 10/15/13 0608 10/15/13 0945  BP:  132/69 104/64 116/59  Pulse:  139 124 126  Temp:  99.5 F (37.5 C) 98.7 F (37.1 C) 98.8 F (37.1 C)  TempSrc:  Oral Oral Oral  Resp:  24 22 25   Height:      Weight:  55.747 kg (122 lb 14.4 oz)    SpO2: 98% 100% 94% 95%    Intake/Output Summary (Last 24 hours) at 10/15/13 1316 Last data filed at 10/15/13 0705  Gross per 24 hour  Intake 1493.8 ml  Output   1250 ml  Net  243.8 ml   Filed Weights   10/13/13 0459 10/13/13 2112 10/14/13 2106  Weight: 55.7 kg (122 lb 12.7 oz) 56.155 kg (123 lb 12.8 oz) 55.747 kg (122 lb 14.4 oz)    Exam:   General:  awake  Cardiovascular: RRR  Respiratory: Decreased breath sounds right side.  Abdomen: S/ND/+BS  Extremities: no C/C/E  Neurologic:  Moves all 4 spontaneuosly  Data Reviewed: Basic Metabolic Panel:  Recent Labs Lab 10/09/13 1645 10/10/13 0353  NA 138 137  K 5.4* 3.7  CL 102 101  CO2 25 21  GLUCOSE 101* 107*  BUN 9 8  CREATININE 0.72 0.71  CALCIUM 8.3* 8.6   Liver Function Tests: No results found for this basename: AST, ALT, ALKPHOS, BILITOT, PROT, ALBUMIN,  in the last 168 hours No results found for this basename: LIPASE, AMYLASE,  in  the last 168 hours No results found for this basename: AMMONIA,  in the last 168 hours CBC:  Recent Labs Lab 10/09/13 1645 10/10/13 0353  WBC 7.2 7.7  NEUTROABS 5.5  --   HGB 13.2 13.0  HCT 38.6* 34.0*  MCV 101.3* 98.6  PLT 126* 126*   Cardiac Enzymes: No results found for this basename: CKTOTAL, CKMB, CKMBINDEX, TROPONINI,  in the last 168 hours BNP (last 3 results)  Recent Labs  10/09/13 1730  PROBNP 234.6*   CBG:  Recent Labs Lab 10/14/13 2112  GLUCAP 138*    Recent Results (from the past 240 hour(s))  MRSA PCR SCREENING     Status: None   Collection Time    10/09/13 10:32 PM      Result Value Range Status   MRSA by PCR NEGATIVE  NEGATIVE Final   Comment:            The GeneXpert MRSA Assay (FDA     approved for NASAL specimens     only), is one component of a     comprehensive MRSA colonization     surveillance program. It is not     intended to diagnose MRSA     infection nor to guide or     monitor treatment for     MRSA infections.  CULTURE, BLOOD (ROUTINE X 2)     Status: None   Collection Time  10/10/13 12:30 AM      Result Value Range Status   Specimen Description BLOOD RIGHT HAND   Final   Special Requests BOTTLES DRAWN AEROBIC ONLY 3CC   Final   Culture  Setup Time     Final   Value: 10/10/2013 12:14     Performed at [facility]   Culture     Final   Value:        BLOOD CULTURE RECEIVED NO GROWTH TO DATE CULTURE WILL BE HELD FOR 5 DAYS BEFORE ISSUING A FINAL NEGATIVE REPORT     Performed at [facility]   Report Status PENDING   Incomplete  CULTURE, BLOOD (ROUTINE X 2)     Status: None   Collection Time    10/10/13 12:35 AM      Result Value Range Status   Specimen Description BLOOD RIGHT WRIST   Final   Special Requests BOTTLES DRAWN AEROBIC ONLY 1CC   Final   Culture  Setup Time     Final   Value: 10/10/2013 12:14     Performed at [facility]   Culture     Final   Value:        BLOOD CULTURE RECEIVED NO  GROWTH TO DATE CULTURE WILL BE HELD FOR 5 DAYS BEFORE ISSUING A FINAL NEGATIVE REPORT     Performed at [facility]   Report Status PENDING   Incomplete  BODY FLUID CULTURE     Status: None   Collection Time    10/10/13  1:53 PM      Result Value Range Status   Specimen Description FLUID RIGHT PLEURAL   Final   Special Requests NONE   Final   Gram Stain     Final   Value: RARE WBC PRESENT,BOTH PMN AND MONONUCLEAR     NO ORGANISMS SEEN     Performed at [facility]   Culture     Final   Value: NO GROWTH 3 DAYS     Performed at [facility]   Report Status 10/13/2013 FINAL   Final     Studies: Dg Chest Port 1 View  10/14/2013   CLINICAL DATA:  ronchi, pneumonia  EXAM: PORTABLE CHEST - 1 VIEW  COMPARISON:  DG CHEST 1V PORT dated 10/12/2013  FINDINGS: Right power port in place. There is complete opacification of the right hemi thorax unchanged from prior. The cardiac silhouette is partially obscured but appears unchanged from prior. Left lung is clear.  IMPRESSION: 1. No interval change. 2. Complete opacification right hemi thorax   Electronically Signed   By: Suzy Bouchard M.D.   On: 10/14/2013 20:20    Scheduled Meds: . ipratropium-albuterol  3 mL Nebulization TID  . lacosamide  100 mg Oral BID  . levETIRAcetam  1,000 mg Intravenous Q12H  . mirtazapine  15 mg Oral QHS  . mometasone-formoterol  2 puff Inhalation BID  . morphine CONCENTRATE  5 mg Oral Q6H  . phenytoin (DILANTIN) IV  190 mg Intravenous BID  . pregabalin  100 mg Oral Daily  . senna-docusate  1 tablet Oral Daily   Continuous Infusions:    Principal Problem:   Healthcare-associated pneumonia Active Problems:   Pleural effusion   HCAP (healthcare-associated pneumonia)   DNR (do not resuscitate)   Seizure disorder    Time spent: 25 minutes. Greater than 50% of this time was spent in direct contact with the patient coordinating care.    HERNANDEZ  [facility]  [facility] Pager [phone]  If 7PM-7AM, please contact night-coverage at [email or web address], password [facility] 10/15/2013, 1:16 PM  LOS: 6 days

## 2013-10-15 NOTE — Consult Note (Signed)
[facility] Liaison: Received request from [place] for family interest in [facility]. Chart reviewed, no family present. Unfortunately no [facility] availability at this time. CSW aware. Will update CSW re availability in am. Thank you. Erling Conte LCSW [phone]

## 2013-10-15 NOTE — Progress Notes (Signed)
Speech Language Pathology Treatment: Dysphagia  Patient Details Name: [name] MRN: [id number] DOB: 22-Oct-1945 Today's Date: 10/15/2013 Time: 0950-1006 SLP Time Calculation (min): 16 min  Assessment / Plan / Recommendation Clinical Impression  Pt seen prior to cancellation of order. F/u in setting of probable aspiration event last night but also decision to transition to comfort measure. SLP assisted RN with pill administration and suggesting appropriate compensatory strategies to reduce aspiration risk. Pt immediately with moderate size sip sand hard prolonged ineffective uncomfortable cough with probable aspiration. With increased assist for small cup sips pt with risk, but decreased discomfort. Pt also encouraged to swallow twice with puree to reduce residuals, which he was able to do with verbal cues and time. Recommend pt continue dys 1/ thin liquids with suggested precautions (no straws, small single sips, frequent repositioning and oral care) No SLP f/u needed at this time.    HPI HPI: [name]  is a 68 y.o. male, nursing home resident, with history of non-small lung cancer who was sent here for evaluation of hypoxemia noted in the nursing home. Chest x-ray showed pleural effusion and the patient was found to be febrile.   Pertinent Vitals Increased RR, SOB with PO intake, RN present  SLP Plan  Discharge SLP treatment due to (comment) (orders now d/c'd pt to transition to comfort. )    Recommendations Diet recommendations: Dysphagia 1 (puree);Thin liquid Liquids provided via: Cup;No straw Medication Administration: Whole meds with puree Supervision: Full supervision/cueing for compensatory strategies Compensations: Slow rate;Small sips/bites;Multiple dry swallows after each bite/sip Postural Changes and/or Swallow Maneuvers: Seated upright 90 degrees;Upright 30-60 min after meal              Oral Care Recommendations: Oral care BID Follow up Recommendations:  Skilled Nursing facility Plan: Discharge SLP treatment due to (comment) (orders now d/c'd pt to transition to comfort. )    GO    Herbie Baltimore, MA CCC-SLP [phone]  Tereasa Yilmaz, [name] 10/15/2013, 11:58 AM

## 2013-10-15 NOTE — Progress Notes (Signed)
Speech Pathology Note  Evaluated by SLP on Friday, pt did demonstrate risk for aspiration, possibly even with thick textures, but did not recommend MBS unless aggressive medical care desired by family. Noted event last night and suggestion for comfort measures. Please inform SLP of plan - order MBS unless pt to transition to comfort.  Thanks. Herbie Baltimore, [place] CCC-SLP [phone]

## 2013-10-16 DIAGNOSIS — C3410 Malignant neoplasm of upper lobe, unspecified bronchus or lung: Secondary | ICD-10-CM

## 2013-10-16 LAB — LEGIONELLA ANTIGEN, URINE: LEGIONELLA ANTIGEN, URINE: NEGATIVE

## 2013-10-16 LAB — CULTURE, BLOOD (ROUTINE X 2)
CULTURE: NO GROWTH
Culture: NO GROWTH

## 2013-10-16 NOTE — Progress Notes (Signed)
[facility] PROGRESS NOTE  Umar Nordling SAY:[id number] DOB: 23-Jan-1946 DOA: 10/09/2013 PCP: No primary provider on file.  Assessment/Plan: Metastatic NSCLC -With disease progression. -Is s/p right thoracentesis with re-accumulation of fluid. -Strongly support palliative care and hospice as well. Consult has been placed.  ??Postobstructive PNA -abx have been discontinued.  Code Status: DNR Family Communication: None today  Disposition Plan: Appreciate palliative consultation. Plan for Residential Hospice. SW has been alerted.   Consultants:  IR  Oncology   Antibiotics:  None  Subjective: No complaints. Opens eyes to voice.  Objective: Filed Vitals:   10/16/13 0439 10/16/13 0745 10/16/13 0849 10/16/13 1415  BP: 112/72  95/56   Pulse: 100  62   Temp: 98.1 F (36.7 C)  98.2 F (36.8 C)   TempSrc: Oral  Oral   Resp: 22  16   Height:      Weight:      SpO2: 94% 88% 88% 93%    Intake/Output Summary (Last 24 hours) at 10/16/13 1448 Last data filed at 10/16/13 1128  Gross per 24 hour  Intake  323.8 ml  Output    800 ml  Net -476.2 ml   Filed Weights   10/13/13 2112 10/14/13 2106 10/15/13 2222  Weight: 56.155 kg (123 lb 12.8 oz) 55.747 kg (122 lb 14.4 oz) 55.792 kg (123 lb)    Exam:   General:  awake  Cardiovascular: RRR  Respiratory: Decreased breath sounds right side.  Abdomen: S/ND/+BS  Extremities: no C/C/E  Neurologic:  Moves all 4 spontaneuosly  Data Reviewed: Basic Metabolic Panel:  Recent Labs Lab 10/09/13 1645 10/10/13 0353  NA 138 137  K 5.4* 3.7  CL 102 101  CO2 25 21  GLUCOSE 101* 107*  BUN 9 8  CREATININE 0.72 0.71  CALCIUM 8.3* 8.6   Liver Function Tests: No results found for this basename: AST, ALT, ALKPHOS, BILITOT, PROT, ALBUMIN,  in the last 168 hours No results found for this basename: LIPASE, AMYLASE,  in the last 168 hours No results found for this basename: AMMONIA,  in the last 168  hours CBC:  Recent Labs Lab 10/09/13 1645 10/10/13 0353  WBC 7.2 7.7  NEUTROABS 5.5  --   HGB 13.2 13.0  HCT 38.6* 34.0*  MCV 101.3* 98.6  PLT 126* 126*   Cardiac Enzymes: No results found for this basename: CKTOTAL, CKMB, CKMBINDEX, TROPONINI,  in the last 168 hours BNP (last 3 results)  Recent Labs  10/09/13 1730  PROBNP 234.6*   CBG:  Recent Labs Lab 10/14/13 2112  GLUCAP 138*    Recent Results (from the past 240 hour(s))  MRSA PCR SCREENING     Status: None   Collection Time    10/09/13 10:32 PM      Result Value Range Status   MRSA by PCR NEGATIVE  NEGATIVE Final   Comment:            The GeneXpert MRSA Assay (FDA     approved for NASAL specimens     only), is one component of a     comprehensive MRSA colonization     surveillance program. It is not     intended to diagnose MRSA     infection nor to guide or     monitor treatment for     MRSA infections.  CULTURE, BLOOD (ROUTINE X 2)     Status: None   Collection Time    10/10/13 12:30 AM      Result Value  Range Status   Specimen Description BLOOD RIGHT HAND   Final   Special Requests BOTTLES DRAWN AEROBIC ONLY 3CC   Final   Culture  Setup Time     Final   Value: 10/10/2013 12:14     Performed at [facility]   Culture     Final   Value: NO GROWTH 5 DAYS     Performed at [facility]   Report Status 10/16/2013 FINAL   Final  CULTURE, BLOOD (ROUTINE X 2)     Status: None   Collection Time    10/10/13 12:35 AM      Result Value Range Status   Specimen Description BLOOD RIGHT WRIST   Final   Special Requests BOTTLES DRAWN AEROBIC ONLY 1CC   Final   Culture  Setup Time     Final   Value: 10/10/2013 12:14     Performed at [facility]   Culture     Final   Value: NO GROWTH 5 DAYS     Performed at [facility]   Report Status 10/16/2013 FINAL   Final  BODY FLUID CULTURE     Status: None   Collection Time    10/10/13  1:53 PM      Result Value Range Status    Specimen Description FLUID RIGHT PLEURAL   Final   Special Requests NONE   Final   Gram Stain     Final   Value: RARE WBC PRESENT,BOTH PMN AND MONONUCLEAR     NO ORGANISMS SEEN     Performed at [facility]   Culture     Final   Value: NO GROWTH 3 DAYS     Performed at [facility]   Report Status 10/13/2013 FINAL   Final     Studies: Dg Chest Port 1 View  10/14/2013   CLINICAL DATA:  ronchi, pneumonia  EXAM: PORTABLE CHEST - 1 VIEW  COMPARISON:  DG CHEST 1V PORT dated 10/12/2013  FINDINGS: Right power port in place. There is complete opacification of the right hemi thorax unchanged from prior. The cardiac silhouette is partially obscured but appears unchanged from prior. Left lung is clear.  IMPRESSION: 1. No interval change. 2. Complete opacification right hemi thorax   Electronically Signed   By: Suzy Bouchard M.D.   On: 10/14/2013 20:20    Scheduled Meds: . ipratropium-albuterol  3 mL Nebulization TID  . lacosamide  100 mg Oral BID  . levETIRAcetam  1,000 mg Intravenous Q12H  . mirtazapine  15 mg Oral QHS  . mometasone-formoterol  2 puff Inhalation BID  . morphine CONCENTRATE  5 mg Oral Q6H  . phenytoin (DILANTIN) IV  190 mg Intravenous BID  . pregabalin  100 mg Oral Daily  . senna-docusate  1 tablet Oral Daily   Continuous Infusions:    Principal Problem:   Healthcare-associated pneumonia Active Problems:   Pleural effusion   HCAP (healthcare-associated pneumonia)   DNR (do not resuscitate)   Seizure disorder    Time spent: 25 minutes. Greater than 50% of this time was spent in direct contact with the patient coordinating care.    Lelon Frohlich  [facility] Pager [phone]  If 7PM-7AM, please contact night-coverage at [email or web address], password [facility] 10/16/2013, 2:48 PM  LOS: 7 days

## 2013-10-16 NOTE — Clinical Social Work Note (Signed)
Discharge plan for patient is residential hospice. CSW was informed by Erling Conte, CSW with [facility] that no bed availability today. Referral made to Yetta Glassman, RN with [facility]. After talking with family, reviewing clinicals and talking with medical director, CSW advised that patient meets for residential hospice and can d/c Wednesday, 1/21. Beverlee Nims will meet with patient's brother Wednesday morning. at 10 am to complete admissions paperwork.   Kaeleen Odom Givens, MSW, LCSW [phone]

## 2013-10-17 DIAGNOSIS — J910 Malignant pleural effusion: Principal | ICD-10-CM

## 2013-10-17 DIAGNOSIS — Z66 Do not resuscitate: Secondary | ICD-10-CM

## 2013-10-17 DIAGNOSIS — G40909 Epilepsy, unspecified, not intractable, without status epilepticus: Secondary | ICD-10-CM

## 2013-10-17 MED ORDER — MORPHINE SULFATE (CONCENTRATE) 10 MG /0.5 ML PO SOLN: 5.0000 mg | Freq: Four times a day (QID) | ORAL | Status: AC

## 2013-10-17 MED ORDER — LORAZEPAM 1 MG PO TABS: 1.0000 mg | ORAL_TABLET | Freq: Four times a day (QID) | ORAL | Status: AC | PRN

## 2013-10-17 MED ORDER — MORPHINE SULFATE (CONCENTRATE) 10 MG /0.5 ML PO SOLN: 5.0000 mg | ORAL | Status: AC | PRN

## 2013-10-17 MED ORDER — PREGABALIN 100 MG PO CAPS: 100.0000 mg | ORAL_CAPSULE | Freq: Every day | ORAL | Status: AC

## 2013-10-17 NOTE — Progress Notes (Signed)
Patient discharged to [facility] by ambulance. Report was called prior to patients discharge. Per RN at [facility], IV was left in patient. Patient also transported with condom catheter intact. Patient was discharged with belongings. Stable upon discharge.

## 2013-10-17 NOTE — Consult Note (Signed)
[place] Liaison: Continue to follow for family interest in [facility]. Unfortunately still no Administrator. Will let CSW know at 8:30 this am. Thank you. Erling Conte LCSW [phone]

## 2013-10-17 NOTE — Discharge Summary (Signed)
Physician Discharge Summary  Sheriff Rodenberg WUJ:[id number] DOB: 08-12-1946 DOA: 10/09/2013  PCP: No primary provider on file.  Admit date: 10/09/2013 Discharge date: 10/17/2013  Time spent: Greater than 30 minutes  Recommendations for Outpatient Follow-up:  1. Patient will be discharging to a residential hospice for end-of-life care.  Discharge Diagnoses:  Principal Problem:   Healthcare-associated pneumonia Active Problems:   Pleural effusion   HCAP (healthcare-associated pneumonia)   DNR (do not resuscitate)   Seizure disorder   Discharge Condition: Improved & Stable  Diet recommendation: Declining  Filed Weights   10/13/13 2112 10/14/13 2106 10/15/13 2222  Weight: 56.155 kg (123 lb 12.8 oz) 55.747 kg (122 lb 14.4 oz) 55.792 kg (123 lb)    History of present illness & hospital course:  68 year old male with history of metastatic non-small cell lung cancer initially diagnosed in December 2013, seizure disorder on 3 different antiepileptic medications, prostate cancer, hypertension, was transferred from SNF due to hypoxia. Evaluation revealed large right-sided pleural effusion which was likely a malignant effusion secondary to lung cancer. He was initially empirically treated for healthcare acquired versus postobstructive pneumonia with broad-spectrum antibiotics. Influenza panel PCR was negative. He underwent thoracentesis by interventional radiology on 10/10/13 and confirmed malignant pleural effusion. Dr. Julien Nordmann from oncology consulted on 1/17 and indicated that patient had disease progression in November 2014. Patient was interested in further chemotherapy but was lost to followup and did not show up for his treatments. He stated that patient is not a good candidate for systemic therapy and strongly recommended palliative care and hospice at this point. Palliative care team was consulted. Dr. Lane Hacker evaluated patient on 10/15/13. She discussed extensively with patient's  brother Evelena Peat and plan was for: DO NOT RESUSCITATE, comfort care, no additional systemic chemotherapy, de-escalate medications-antibiotics discontinued, stopped unnecessary medications, placed on scheduled Roxanol and when necessary Ativan for agitation, continued seizure medications and comfort feeding. It was not felt that Pleurx catheter would help him significantly from a comfort standpoint because he did not dramatically improve after the first thoracentesis. Continue oxygen via nasal cannula for comfort. Unclear why he is on Lyrica-opiates should be appropriate for symptom management. Thereby, Lyrica is being weaned down. Prognosis is weeks. As per nursing, patient is comfortable and has taken his oral medications today. He will be transferred to residential hospice for comfort/end of life care.   Consultations:  Palliative care team  Medical oncology  Interventional radiology  Procedures:  Thoracentesis    Discharge Exam:  Complaints:  Patient is nonverbal  Filed Vitals:   10/16/13 1744 10/16/13 1931 10/17/13 0453 10/17/13 0900  BP: 103/66  104/74   Pulse: 73  133   Temp: 98.3 F (36.8 C)  98.6 F (37 C)   TempSrc: Oral  Axillary   Resp: 18  17   Height:      Weight:      SpO2: 91% 92% 90% 93%    General exam: Chronically ill looking male, looking around but nonverbal and lying comfortably in bed without distress. Respiratory system: Decreased breath sounds right lung fields and left base.. No increased work of breathing. No wheezing, rhonchi or crackles. Cardiovascular system: S1 & S2 heard, RRR. No JVD, murmurs, gallops, clicks or pedal edema. Gastrointestinal system: Abdomen is nondistended, soft and nontender. Normal bowel sounds heard. Central nervous system: Alert and not oriented and nonverbal.  Extremities: Moving right extremities. Contractures of left upper extremity. Left AKA stump healed.  Discharge Instructions      Discharge Orders  Future Orders  Complete By Expires   Bed rest  As directed    Call MD for:  difficulty breathing, headache or visual disturbances  As directed    Call MD for:  persistant nausea and vomiting  As directed    Call MD for:  severe uncontrolled pain  As directed    Call MD for:  temperature >100.4  As directed    Discharge instructions  As directed    Comments:     1. Continue oxygen via nasal cannula at 1 L per minute continuously. Titrate for comfort. 2. Comfort feeds of choice.       Medication List    STOP taking these medications       acetaminophen 325 MG tablet  Commonly known as:  TYLENOL     aspirin 81 MG tablet     atorvastatin 20 MG tablet  Commonly known as:  LIPITOR     Cholecalciferol 50000 UNITS capsule     folic acid 1 MG tablet  Commonly known as:  FOLVITE     hyaluronate sodium Gel     HYDROcodone-acetaminophen 5-325 MG per tablet  Commonly known as:  NORCO/VICODIN     lidocaine-prilocaine cream  Commonly known as:  EMLA     loratadine 10 MG tablet  Commonly known as:  CLARITIN     multivitamin with minerals Tabs tablet     multivitamins with iron Tabs tablet     nitroGLYCERIN 0.4 MG SL tablet  Commonly known as:  NITROSTAT     prochlorperazine 10 MG tablet  Commonly known as:  COMPAZINE      TAKE these medications       AKWA TEARS 0.5 % ophthalmic ointment  Place 1 drop into the right eye as needed (for dry eyes).     Fluticasone-Salmeterol 100-50 MCG/DOSE Aepb  Commonly known as:  ADVAIR  Inhale 1 puff into the lungs every 12 (twelve) hours.     ipratropium-albuterol 0.5-2.5 (3) MG/3ML Soln  Commonly known as:  DUONEB  Take 3 mLs by nebulization every 6 (six) hours as needed (for shortness of breath).     levETIRAcetam 1000 MG tablet  Commonly known as:  KEPPRA  Take 1,000 mg by mouth 2 (two) times daily.     LORazepam 1 MG tablet  Commonly known as:  ATIVAN  Take 1 tablet (1 mg total) by mouth every 6 (six) hours as needed for anxiety, seizure  or sedation.     mirtazapine 15 MG disintegrating tablet  Commonly known as:  REMERON SOL-TAB  Take 15 mg by mouth at bedtime.     morphine CONCENTRATE 10 mg / 0.5 ml concentrated solution  Take 0.25 mLs (5 mg total) by mouth every 6 (six) hours.     morphine CONCENTRATE 10 mg / 0.5 ml concentrated solution  Take 0.25 mLs (5 mg total) by mouth every 4 (four) hours as needed for shortness of breath (pain).     phenytoin 100 MG ER capsule  Commonly known as:  DILANTIN  Take 200 mg by mouth 2 (two) times daily. Take 2 capsules bid     pregabalin 100 MG capsule  Commonly known as:  LYRICA  Take 1 capsule (100 mg total) by mouth daily.     sennosides-docusate sodium 8.6-50 MG tablet  Commonly known as:  SENOKOT-S  Take 1 tablet by mouth daily.     VIMPAT 100 MG Tabs  Generic drug:  Lacosamide  Take 1 tablet by mouth  2 (two) times daily.          The results of significant diagnostics from this hospitalization (including imaging, microbiology, ancillary and laboratory) are listed below for reference.    Significant Diagnostic Studies: Dg Chest 1 View  10/10/2013   CLINICAL DATA:  Post thoracentesis  EXAM: CHEST - 1 VIEW  COMPARISON:  10/09/2013  FINDINGS: Right effusion improved. No pneumothorax. Stable Port-A-Cath. Stable opacities within the right lung.  IMPRESSION: No pneumothorax post thoracentesis.   Electronically Signed   By: Maryclare Bean M.D.   On: 10/10/2013 14:35   Dg Chest Port 1 View  10/14/2013   CLINICAL DATA:  ronchi, pneumonia  EXAM: PORTABLE CHEST - 1 VIEW  COMPARISON:  DG CHEST 1V PORT dated 10/12/2013  FINDINGS: Right power port in place. There is complete opacification of the right hemi thorax unchanged from prior. The cardiac silhouette is partially obscured but appears unchanged from prior. Left lung is clear.  IMPRESSION: 1. No interval change. 2. Complete opacification right hemi thorax   Electronically Signed   By: Suzy Bouchard M.D.   On: 10/14/2013 20:20    Dg Chest Port 1 View  10/12/2013   CLINICAL DATA:  Shortness of breath.  Effusion.  EXAM: PORTABLE CHEST - 1 VIEW  COMPARISON:  10/10/2013.  FINDINGS: Patient is rotated to the right. Port-A-Cath in stable position. Progressive right pleural effusion noted. Underlying atelectasis and/or infiltrate cannot be excluded. There is near opacification of the right hemithorax. Prior CABG. Heart size cannot be evaluated due to opacification of right in the thorax. There is mild pulmonary venous congestion and pulmonary interstitial prominence on the left. A component of congestive heart failure may be present. No pneumothorax. No acute osseous abnormality.  IMPRESSION: 1. Near complete opacification right hemithorax consistent with progressive right pleural effusion. Underlying infiltrate and atelectasis may be present. 2. Pulmonary venous congestion with interstitial prominence on the left noted. A component of congestive heart failure may be present.   Electronically Signed   By: Marcello Moores  Register   On: 10/12/2013 07:36   Dg Chest Portable 1 View  10/09/2013   CLINICAL DATA:  History pleural effusion  EXAM: PORTABLE CHEST - 1 VIEW  COMPARISON:  CT dated 08/03/2013  FINDINGS: A right-sided Port-A-Cath is appreciated with tip project mid superior vena cava. Patient is status post median sternotomy coronary artery bypass grafting. There is diffuse consolidative density within the lung apex and base. Areas of sparing are identified centrally within the lower. The aerated portion of the lung demonstrates diffuse ground-glass appearance with prominence of interstitial markings. The left hemothorax is clear. The osseous structures demonstrate healed fracture involving the left humerus and degenerative changes within the left shoulder.  IMPRESSION: Findings consistent with the appearance described pleural effusion on chest CT. The orientation of the density likely reflects a loculated component. Atelectasis versus  infiltrate versus neoplastic involvement of the aerated portion of the right hemithorax.   Electronically Signed   By: Margaree Mackintosh M.D.   On: 10/09/2013 17:27   US Thoracentesis Asp Pleural Space W/img Guide  10/10/2013   CLINICAL DATA:  Right-sided pleural effusion. Request diagnostic and therapeutic thoracentesis.  EXAM: ULTRASOUND GUIDED right THORACENTESIS  COMPARISON:  None.  FINDINGS: A total of approximately 1.4 L of amber colored fluid was removed. A fluid sample was sent for laboratory analysis.  IMPRESSION: Successful ultrasound guided right thoracentesis yielding 1.4 L of pleural fluid.  Read by: Ascencion Dike PA-C  PROCEDURE: An ultrasound guided thoracentesis  was thoroughly discussed with the patient and questions answered. The benefits, risks, alternatives and complications were also discussed. The patient understands and wishes to proceed with the procedure. Written consent was obtained.  Ultrasound was performed to localize and mark an adequate pocket of fluid in the right chest. The area was then prepped and draped in the normal sterile fashion. 1% Lidocaine was used for local anesthesia. Under ultrasound guidance a 19 gauge Yueh catheter was introduced. Thoracentesis was performed. The catheter was removed and a dressing applied.  Complications:  None immediate   Electronically Signed   By: Sandi Mariscal M.D.   On: 10/10/2013 14:32    Microbiology: Recent Results (from the past 240 hour(s))  MRSA PCR SCREENING     Status: None   Collection Time    10/09/13 10:32 PM      Result Value Range Status   MRSA by PCR NEGATIVE  NEGATIVE Final   Comment:            The GeneXpert MRSA Assay (FDA     approved for NASAL specimens     only), is one component of a     comprehensive MRSA colonization     surveillance program. It is not     intended to diagnose MRSA     infection nor to guide or     monitor treatment for     MRSA infections.  CULTURE, BLOOD (ROUTINE X 2)     Status: None    Collection Time    10/10/13 12:30 AM      Result Value Range Status   Specimen Description BLOOD RIGHT HAND   Final   Special Requests BOTTLES DRAWN AEROBIC ONLY 3CC   Final   Culture  Setup Time     Final   Value: 10/10/2013 12:14     Performed at [facility]   Culture     Final   Value: NO GROWTH 5 DAYS     Performed at [facility]   Report Status 10/16/2013 FINAL   Final  CULTURE, BLOOD (ROUTINE X 2)     Status: None   Collection Time    10/10/13 12:35 AM      Result Value Range Status   Specimen Description BLOOD RIGHT WRIST   Final   Special Requests BOTTLES DRAWN AEROBIC ONLY 1CC   Final   Culture  Setup Time     Final   Value: 10/10/2013 12:14     Performed at [facility]   Culture     Final   Value: NO GROWTH 5 DAYS     Performed at [facility]   Report Status 10/16/2013 FINAL   Final  BODY FLUID CULTURE     Status: None   Collection Time    10/10/13  1:53 PM      Result Value Range Status   Specimen Description FLUID RIGHT PLEURAL   Final   Special Requests NONE   Final   Gram Stain     Final   Value: RARE WBC PRESENT,BOTH PMN AND MONONUCLEAR     NO ORGANISMS SEEN     Performed at [facility]   Culture     Final   Value: NO GROWTH 3 DAYS     Performed at [facility]   Report Status 10/13/2013 FINAL   Final     Labs: Basic Metabolic Panel: No results found for this basename: NA, K, CL, CO2, GLUCOSE, BUN,  CREATININE, CALCIUM, MG, PHOS,  in the last 168 hours Liver Function Tests: No results found for this basename: AST, ALT, ALKPHOS, BILITOT, PROT, ALBUMIN,  in the last 168 hours No results found for this basename: LIPASE, AMYLASE,  in the last 168 hours No results found for this basename: AMMONIA,  in the last 168 hours CBC: No results found for this basename: WBC, NEUTROABS, HGB, HCT, MCV, PLT,  in the last 168 hours Cardiac Enzymes: No results found for this basename: CKTOTAL, CKMB, CKMBINDEX,  TROPONINI,  in the last 168 hours BNP: BNP (last 3 results)  Recent Labs  10/09/13 1730  PROBNP 234.6*   CBG:  Recent Labs Lab 10/14/13 2112  GLUCAP 138*    Additional labs: 1. Pathology 10/10/13: Diagnosis PLEURAL FLUID, RIGHT LUNG, (SPECIMEN 1 OF 1, COLLECTED ON 10/10/13). MALIGNANT CELLS PRESENT. SEE COMMENT.    Signed:  Vernell Leep, MD, FACP, FHM. [facility] Pager [phone]  If 7PM-7AM, please contact night-coverage [email or web address] Password [facility] 10/17/2013, 9:56 AM

## 2013-10-17 NOTE — Clinical Social Work Note (Signed)
Mr. Geimer will discharge to [facility] today. Brother Harinder Romas will complete admissions paperwork for Hospice and patient will be transported by ambulance.  Ronetta Molla Givens, MSW, LCSW [phone]

## 2013-10-28 DEATH — deceased

## 2014-09-08 IMAGING — CT CT CHEST W/ CM
2 of 3 series · 15 of 36 positions shown, 18 images · IV contrast (omnipaque)
Comparison: Chest CT on 04/30/2013

CLINICAL DATA: Followup non-small cell lung carcinoma. Status post
chemotherapy.

EXAM:
CT CHEST WITH CONTRAST
TECHNIQUE: Multidetector CT imaging of the chest was performed during
intravenous contrast administration.
CONTRAST:  80mL OMNIPAQUE IOHEXOL 300 MG/ML  SOLN

[Series 2: chest with st · axial · 0.74mm/px · z∈[-330,-100]mm · 12 of 55 slices shown, 15 images]
[im 5/55  mediastinal]
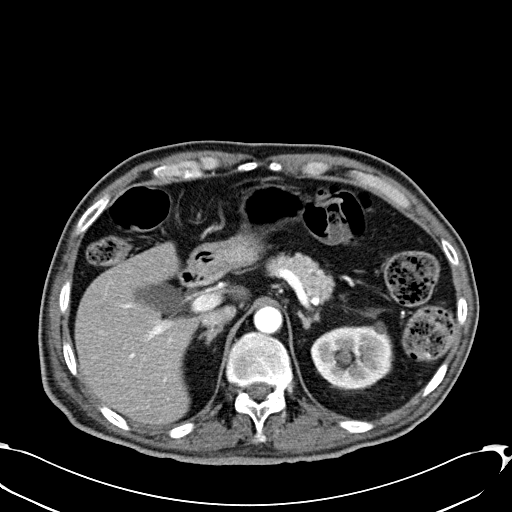
[im 5/55  lung]
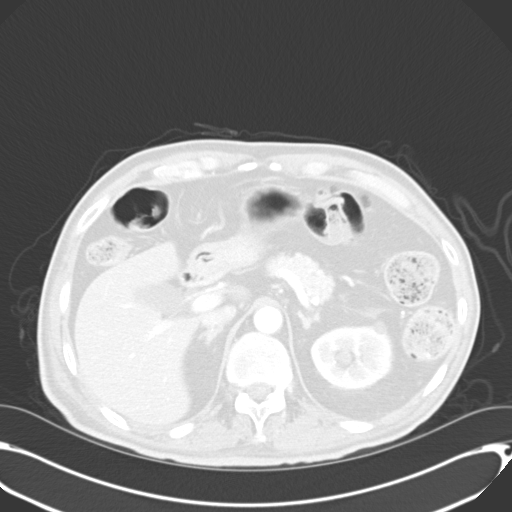
[im 9/55  lung]
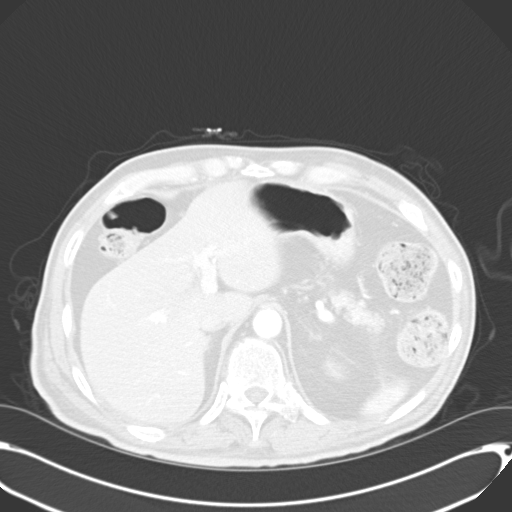
[im 13/55  lung]
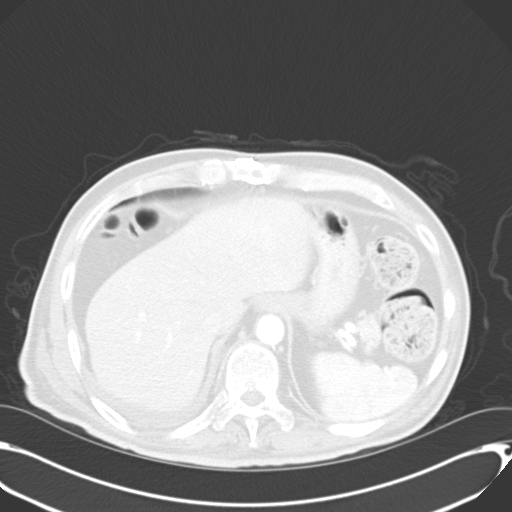
[im 17/55  lung]
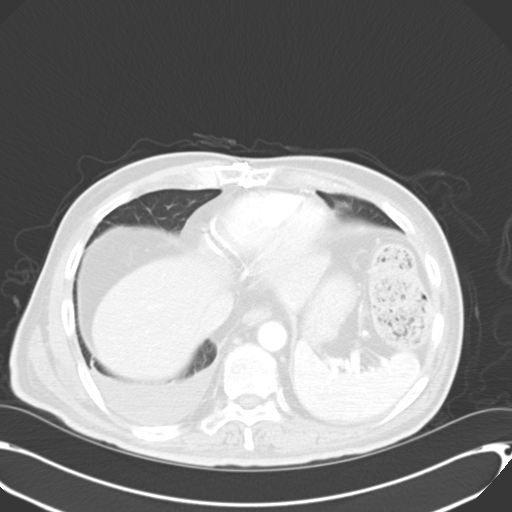
[im 21/55  mediastinal]
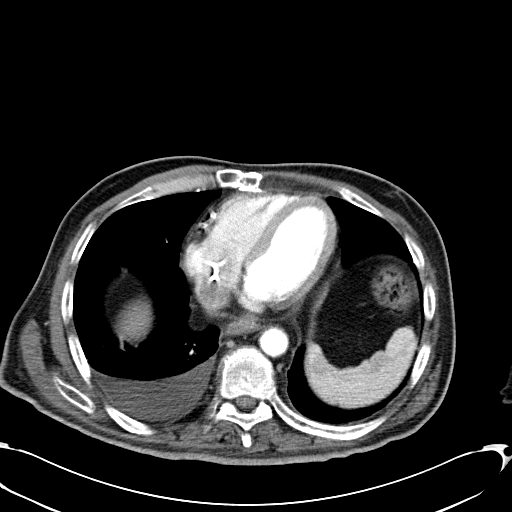
[im 21/55  lung]
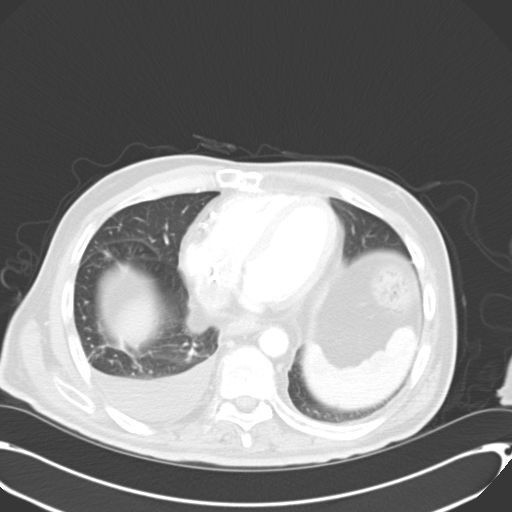
[im 25/55  lung]
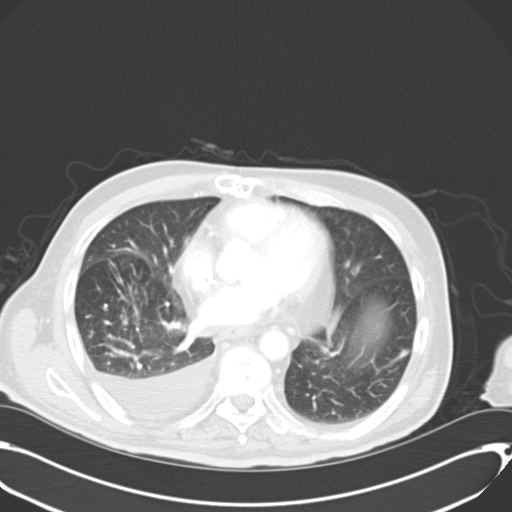
[im 31/55  lung]
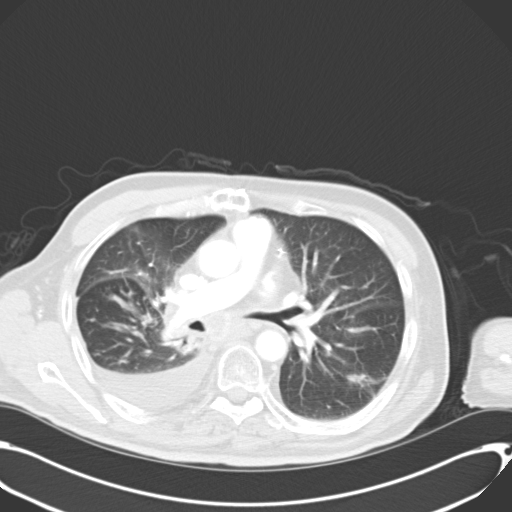
[im 35/55  lung]
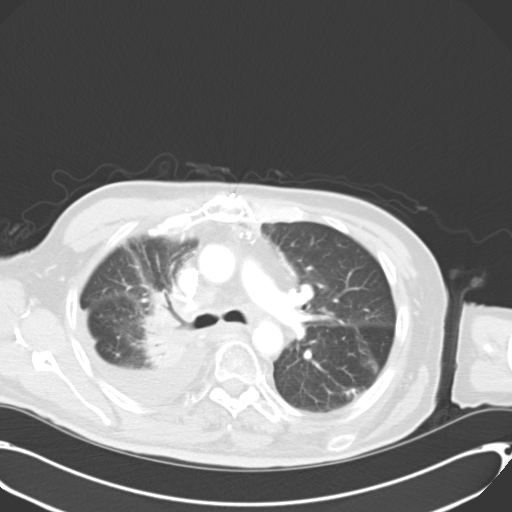
[im 39/55  mediastinal]
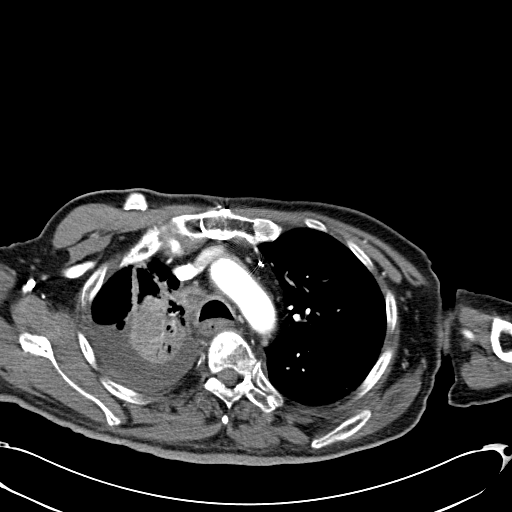
[im 39/55  lung]
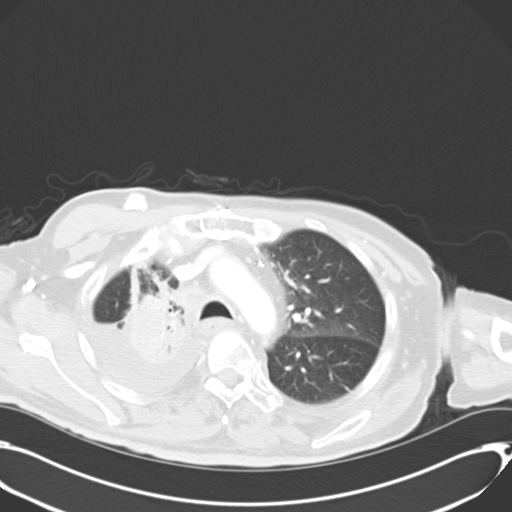
[im 43/55  lung]
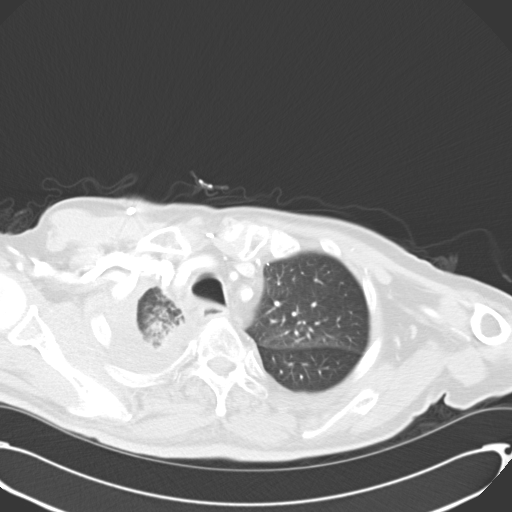
[im 47/55  lung]
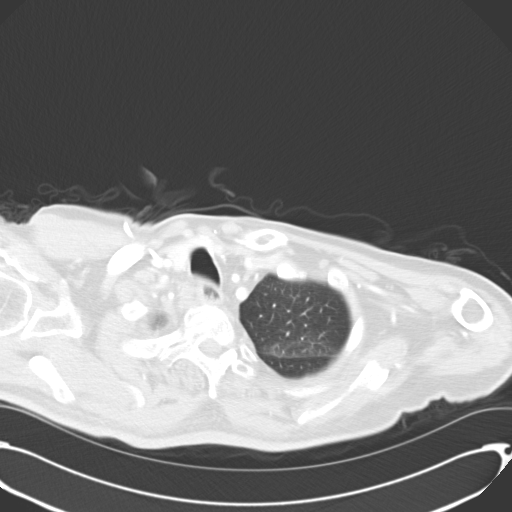
[im 51/55  lung]
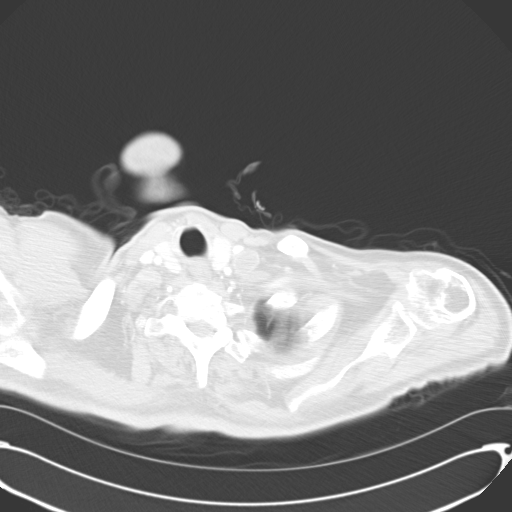

[Series 602: <mpr thick range> · coronal · 0.74mm/px · 3 of 80 slices shown]
[im 16/80  lung]
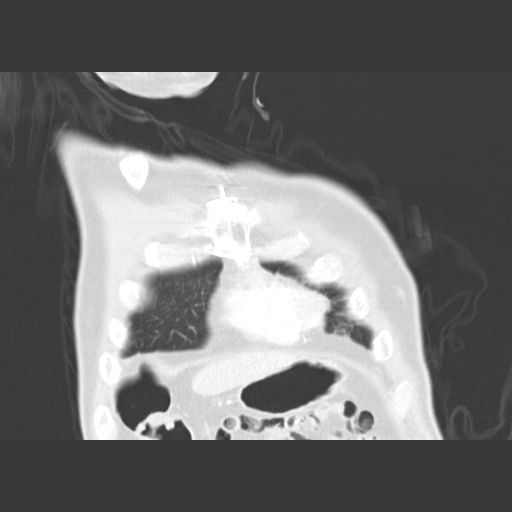
[im 32/80  lung]
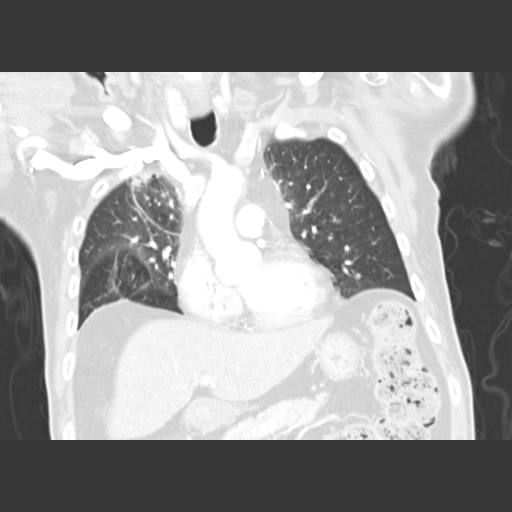
[im 48/80  lung]
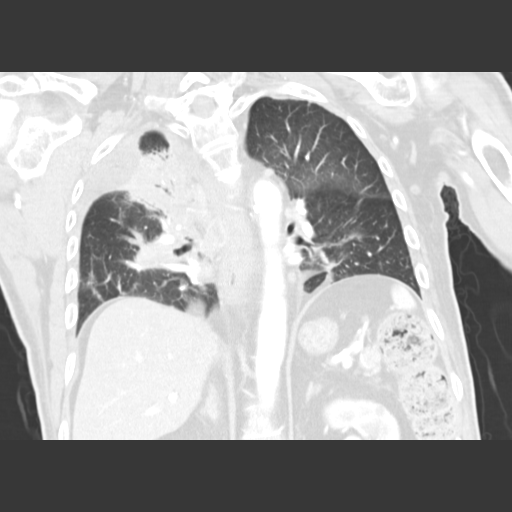

[15 of 36 positions shown; findings below may reference images not displayed]

FINDINGS: Progressive right upper lobe atelectasis and consolidation is seen
the since previous study, which now obscures the previously seen
spiculated nodule in the central right upper lobe.

Small to moderate right pleural effusion is increased in size.
Increased size of the mass in the central right lower lobe is seen,
currently measuring 1.5 x 2.4 cm on image 28 1.2 x 2.0 cm
previously. Increased lymphadenopathy is also seen in the right
hilum and subcarinal region. Sub carinal lymphadenopathy currently
measures 2.1 x 2.5 cm on image 26 compared to 1.2 x 1.8 cm
previously. Mediastinal lymphadenopathy in the high right
paratracheal region is also mildly increased, currently measuring 12
mm on image 11 compared to 8 mm previously. Other areas of
mediastinal lymphadenopathy remains stable.

Mild left lower lobe scarring is stable. No evidence of left lung
mass or pleural effusion. No evidence of chest wall mass or
suspicious bone lesions. The adrenal glands remain normal in
appearance.
IMPRESSION: Mild increase in size of mass in the central right lower lobe, and
increased right hilar and mediastinal lymphadenopathy.

Progressive right upper lobe atelectasis and consolidation, which
now obscures the previously seen right upper lobe nodule.

## 2014-11-14 IMAGING — CR DG CHEST 1V PORT
1 series · 1 of 1 positions shown · non-contrast
Comparison: CT dated 08/03/2013

CLINICAL DATA: History pleural effusion

EXAM:
PORTABLE CHEST - 1 VIEW

[AP]
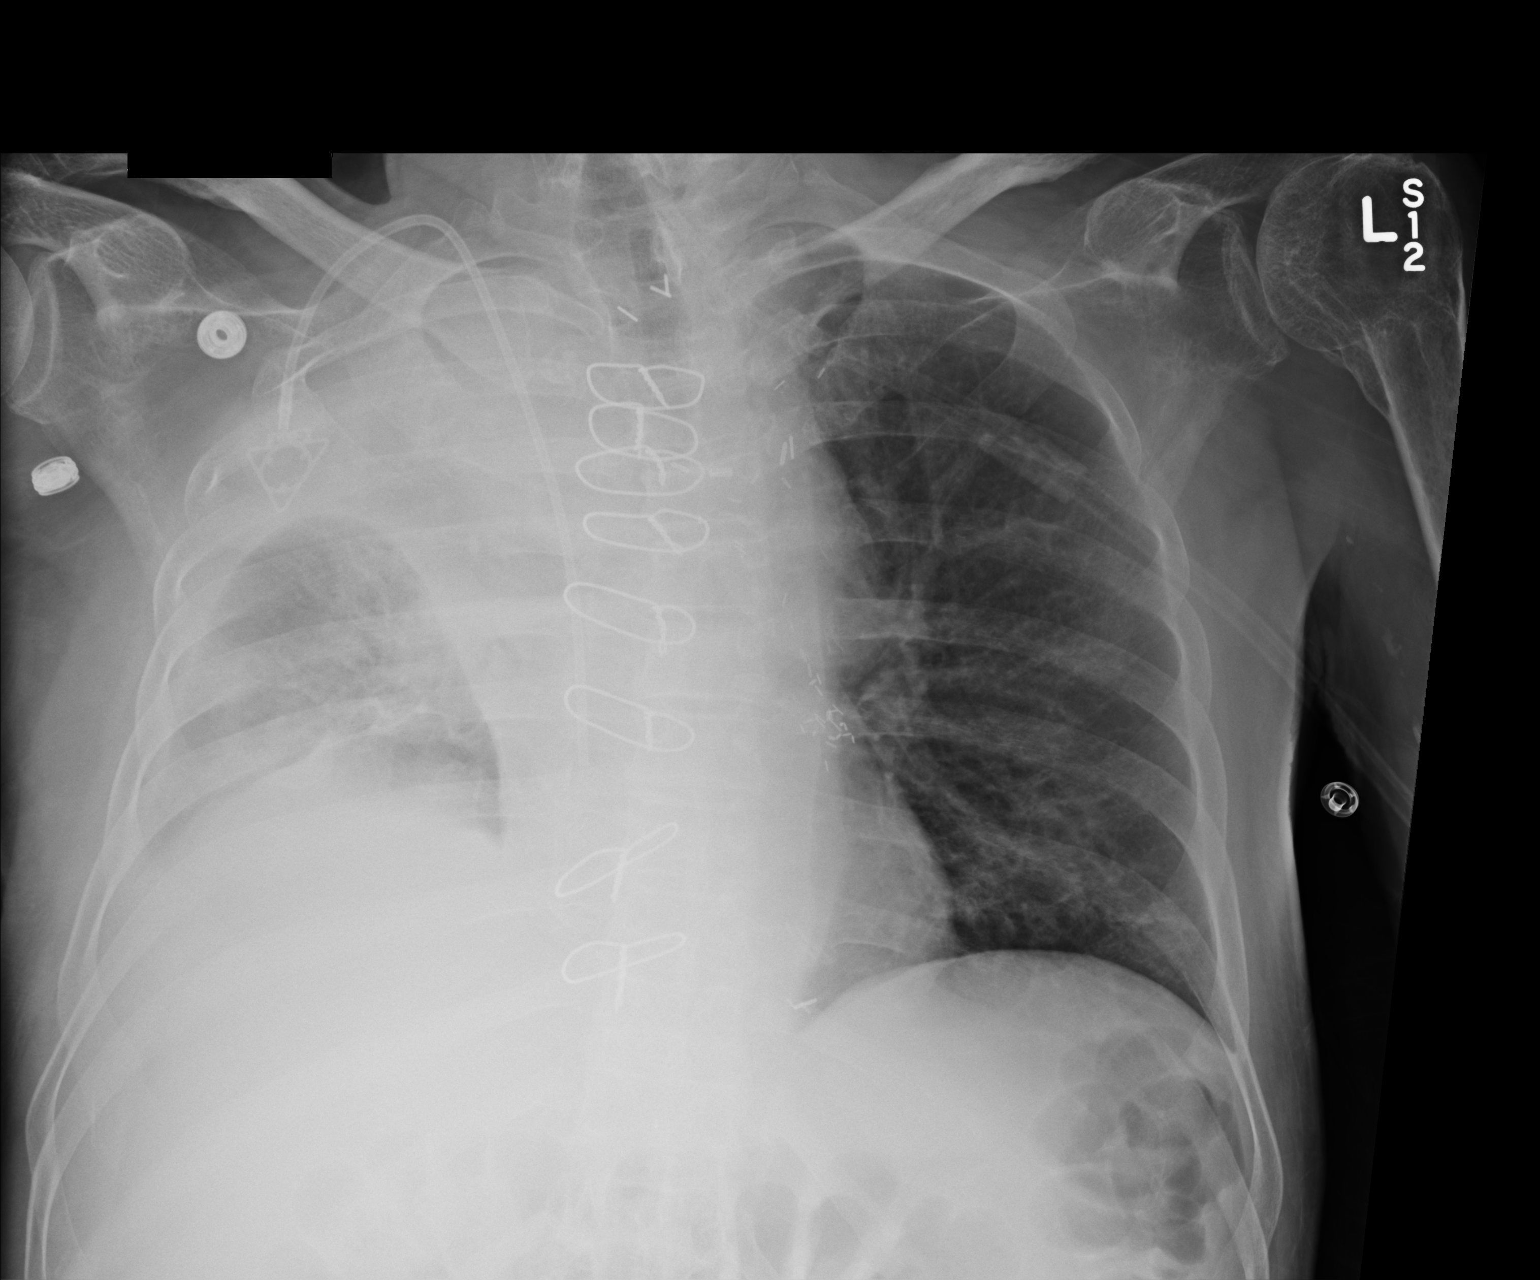

[1 of 1 positions shown; findings below may reference images not displayed]

FINDINGS: A right-sided Port-A-Cath is appreciated with tip project mid
superior vena cava. Patient is status post median sternotomy
coronary artery bypass grafting. There is diffuse consolidative
density within the lung apex and base. Areas of sparing are
identified centrally within the lower. The aerated portion of the
lung demonstrates diffuse ground-glass appearance with prominence of
interstitial markings. The left hemothorax is clear. The osseous
structures demonstrate healed fracture involving the left humerus
and degenerative changes within the left shoulder.
IMPRESSION: Findings consistent with the appearance described pleural effusion
on chest CT. The orientation of the density likely reflects a
loculated component. Atelectasis versus infiltrate versus neoplastic
involvement of the aerated portion of the right hemithorax.

## 2014-11-17 IMAGING — DX DG CHEST 1V PORT
1 series · 1 of 1 positions shown · non-contrast
Comparison: 10/10/2013.

CLINICAL DATA: Shortness of breath.  Effusion.

EXAM:
PORTABLE CHEST - 1 VIEW

[portable]
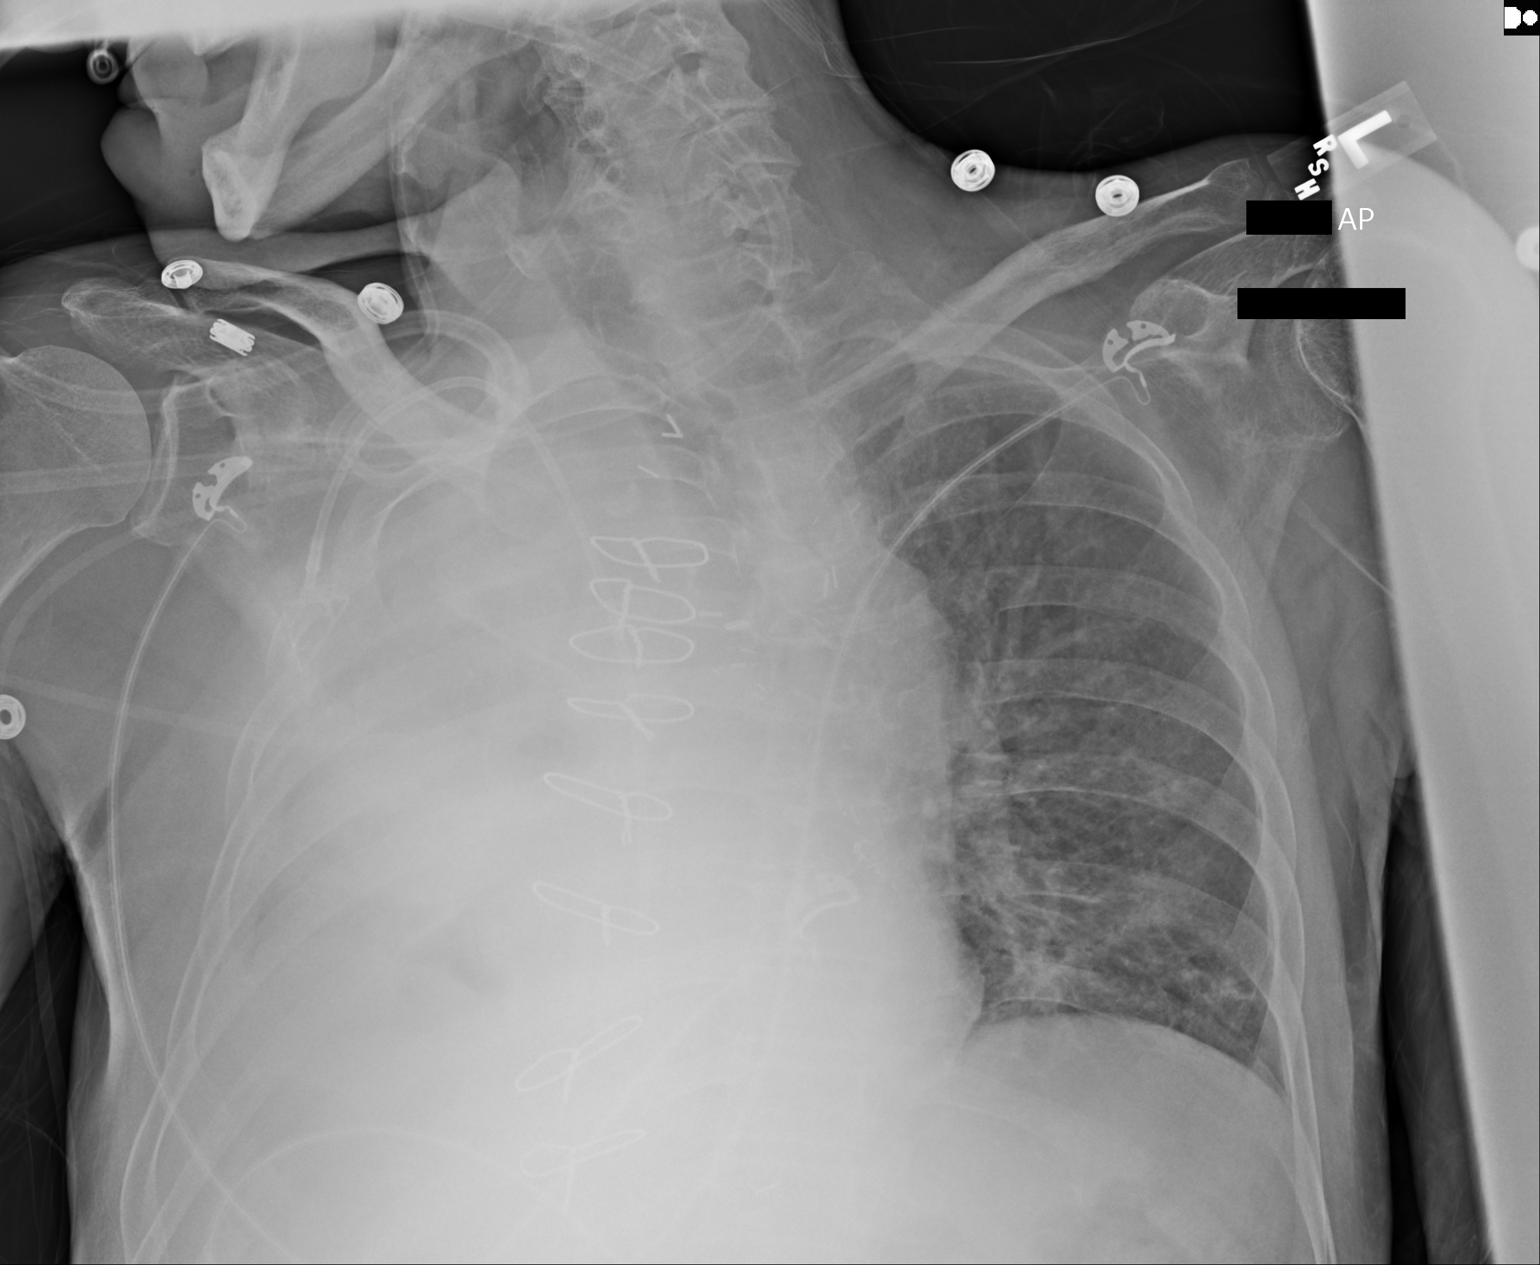

[1 of 1 positions shown; findings below may reference images not displayed]

FINDINGS: Patient is rotated to the right. Port-A-Cath in stable position.
Progressive right pleural effusion noted. Underlying atelectasis
and/or infiltrate cannot be excluded. There is near opacification of
the right hemithorax. Prior CABG. Heart size cannot be evaluated due
to opacification of right in the thorax. There is mild pulmonary
venous congestion and pulmonary interstitial prominence on the left.
A component of congestive heart failure may be present. No
pneumothorax. No acute osseous abnormality.
IMPRESSION: 1. Near complete opacification right hemithorax consistent with
progressive right pleural effusion. Underlying infiltrate and
atelectasis may be present.
2. Pulmonary venous congestion with interstitial prominence on the
left noted. A component of congestive heart failure may be present.

## 2014-11-19 IMAGING — CR DG CHEST 1V PORT
1 series · 1 of 1 positions shown · non-contrast
Comparison: DG CHEST 1V PORT dated 10/12/2013

CLINICAL DATA: ronchi, pneumonia

EXAM:
PORTABLE CHEST - 1 VIEW

[AP]
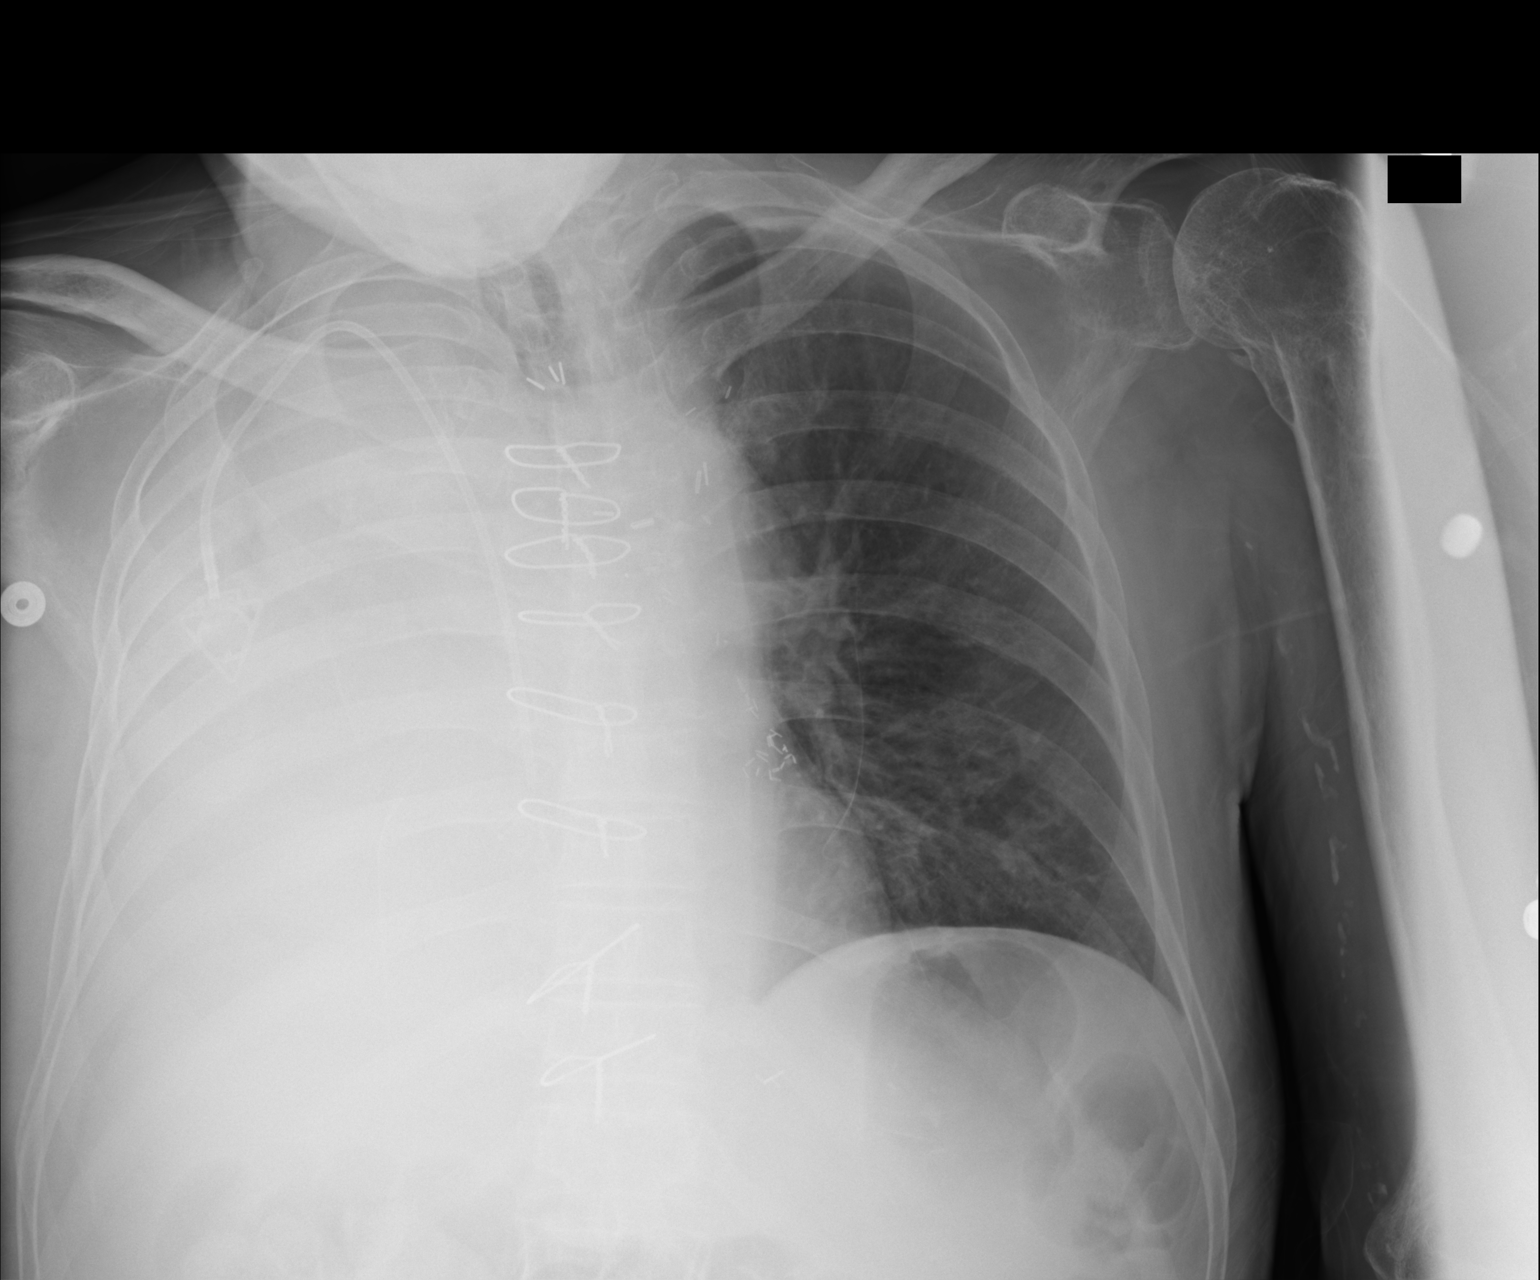

[1 of 1 positions shown; findings below may reference images not displayed]

FINDINGS: Right power port in place. There is complete opacification of the
right hemi thorax unchanged from prior. The cardiac silhouette is
partially obscured but appears unchanged from prior. Left lung is
clear.
IMPRESSION: 1. No interval change.
2. Complete opacification right hemi thorax

## 2017-07-23 ENCOUNTER — Other Ambulatory Visit: Payer: Self-pay | Admitting: Nurse Practitioner
